# Patient Record
Sex: Male | Born: 1955 | Race: White | Hispanic: No | Smoking: Former smoker
Health system: Southern US, Community
[De-identification: ages and names within clinical notes are randomized; demographics above are authoritative.]

## PROBLEM LIST (undated history)

## (undated) DIAGNOSIS — N529 Male erectile dysfunction, unspecified: Secondary | ICD-10-CM

## (undated) DIAGNOSIS — G4700 Insomnia, unspecified: Secondary | ICD-10-CM

## (undated) DIAGNOSIS — Z860100 Personal history of colon polyps, unspecified: Secondary | ICD-10-CM

## (undated) DIAGNOSIS — G4730 Sleep apnea, unspecified: Secondary | ICD-10-CM

## (undated) DIAGNOSIS — C61 Malignant neoplasm of prostate: Secondary | ICD-10-CM

## (undated) DIAGNOSIS — T7840XA Allergy, unspecified, initial encounter: Secondary | ICD-10-CM

## (undated) HISTORY — PX: VASECTOMY: SHX75

## (undated) HISTORY — DX: Personal history of colonic polyps: Z86.010

## (undated) HISTORY — PX: COLONOSCOPY: SHX174

## (undated) HISTORY — DX: Personal history of colon polyps, unspecified: Z86.0100

## (undated) HISTORY — PX: POLYPECTOMY: SHX149

## (undated) HISTORY — DX: Malignant neoplasm of prostate: C61

## (undated) HISTORY — DX: Allergy, unspecified, initial encounter: T78.40XA

---

## 2002-10-28 ENCOUNTER — Emergency Department (HOSPITAL_COMMUNITY): Admission: EM | Admit: 2002-10-28 | Discharge: 2002-10-28 | Payer: Self-pay | Admitting: Emergency Medicine

## 2007-08-20 ENCOUNTER — Ambulatory Visit: Payer: Self-pay | Admitting: Psychiatry

## 2007-08-25 ENCOUNTER — Ambulatory Visit: Payer: Self-pay | Admitting: Psychiatry

## 2007-09-02 ENCOUNTER — Ambulatory Visit: Payer: Self-pay | Admitting: Psychiatry

## 2007-09-11 ENCOUNTER — Ambulatory Visit: Payer: Self-pay | Admitting: Internal Medicine

## 2007-09-16 ENCOUNTER — Ambulatory Visit: Payer: Self-pay | Admitting: Psychology

## 2007-09-18 ENCOUNTER — Ambulatory Visit: Payer: Self-pay | Admitting: Psychiatry

## 2007-09-26 ENCOUNTER — Encounter: Payer: Self-pay | Admitting: Family Medicine

## 2007-09-26 ENCOUNTER — Encounter: Payer: Self-pay | Admitting: Internal Medicine

## 2007-09-26 ENCOUNTER — Ambulatory Visit: Payer: Self-pay | Admitting: Internal Medicine

## 2007-09-29 ENCOUNTER — Encounter: Payer: Self-pay | Admitting: Internal Medicine

## 2007-10-10 ENCOUNTER — Ambulatory Visit: Payer: Self-pay | Admitting: Psychology

## 2007-10-16 ENCOUNTER — Ambulatory Visit: Payer: Self-pay | Admitting: Psychology

## 2007-10-30 ENCOUNTER — Ambulatory Visit: Payer: Self-pay | Admitting: Psychology

## 2007-12-02 ENCOUNTER — Ambulatory Visit: Payer: Self-pay | Admitting: Psychology

## 2007-12-08 ENCOUNTER — Ambulatory Visit: Payer: Self-pay | Admitting: Psychiatry

## 2008-02-03 ENCOUNTER — Ambulatory Visit: Payer: Self-pay | Admitting: Psychology

## 2008-05-31 ENCOUNTER — Ambulatory Visit: Payer: Self-pay | Admitting: Family Medicine

## 2008-05-31 DIAGNOSIS — J309 Allergic rhinitis, unspecified: Secondary | ICD-10-CM | POA: Insufficient documentation

## 2008-06-28 ENCOUNTER — Telehealth: Payer: Self-pay | Admitting: Family Medicine

## 2008-06-28 ENCOUNTER — Ambulatory Visit: Payer: Self-pay | Admitting: Family Medicine

## 2008-06-28 DIAGNOSIS — L255 Unspecified contact dermatitis due to plants, except food: Secondary | ICD-10-CM

## 2008-07-07 ENCOUNTER — Telehealth: Payer: Self-pay | Admitting: Family Medicine

## 2008-08-05 ENCOUNTER — Ambulatory Visit: Payer: Self-pay | Admitting: Psychology

## 2008-09-01 ENCOUNTER — Ambulatory Visit: Payer: Self-pay | Admitting: Psychology

## 2008-09-30 ENCOUNTER — Ambulatory Visit: Payer: Self-pay | Admitting: Family Medicine

## 2008-09-30 DIAGNOSIS — M545 Low back pain, unspecified: Secondary | ICD-10-CM | POA: Insufficient documentation

## 2008-10-11 ENCOUNTER — Ambulatory Visit: Payer: Self-pay | Admitting: Family Medicine

## 2008-10-11 LAB — CONVERTED CEMR LAB
AST: 18 units/L (ref 0–37)
Alkaline Phosphatase: 58 units/L (ref 39–117)
BUN: 23 mg/dL (ref 6–23)
Basophils Absolute: 0.1 10*3/uL (ref 0.0–0.1)
Calcium: 9.3 mg/dL (ref 8.4–10.5)
Cholesterol: 144 mg/dL (ref 0–200)
Creatinine, Ser: 1.2 mg/dL (ref 0.4–1.5)
Eosinophils Absolute: 0.1 10*3/uL (ref 0.0–0.7)
GFR calc non Af Amer: 67.51 mL/min (ref >60)
Glucose, Bld: 99 mg/dL (ref 70–99)
HDL: 39.5 mg/dL (ref >39.00)
Lymphocytes Relative: 15.8 % (ref 12.0–46.0)
MCHC: 34.4 g/dL (ref 30.0–36.0)
Monocytes Relative: 8.1 % (ref 3.0–12.0)
Neutrophils Relative %: 73.2 % (ref 43.0–77.0)
Nitrite: NEGATIVE
Platelets: 191 10*3/uL (ref 150.0–400.0)
RDW: 11.4 % — ABNORMAL LOW (ref 11.5–14.6)
Sodium: 143 meq/L (ref 135–145)
Specific Gravity, Urine: >=1.03 (ref 1.000–1.030)
Total Bilirubin: 0.8 mg/dL (ref 0.3–1.2)
Total Protein, Urine: NEGATIVE mg/dL
Triglycerides: 70 mg/dL (ref 0.0–149.0)
Urine Glucose: NEGATIVE mg/dL
Urobilinogen, UA: 0.2 (ref 0.0–1.0)
VLDL: 14 mg/dL (ref 0.0–40.0)

## 2008-10-20 ENCOUNTER — Ambulatory Visit: Payer: Self-pay | Admitting: Family Medicine

## 2009-01-19 ENCOUNTER — Ambulatory Visit: Payer: Self-pay | Admitting: Family Medicine

## 2009-03-02 ENCOUNTER — Ambulatory Visit: Payer: Self-pay | Admitting: Family Medicine

## 2009-05-18 ENCOUNTER — Ambulatory Visit: Payer: Self-pay | Admitting: Family Medicine

## 2009-08-16 ENCOUNTER — Ambulatory Visit: Payer: Self-pay | Admitting: Family Medicine

## 2009-08-16 DIAGNOSIS — IMO0002 Reserved for concepts with insufficient information to code with codable children: Secondary | ICD-10-CM | POA: Insufficient documentation

## 2009-09-15 ENCOUNTER — Encounter (INDEPENDENT_AMBULATORY_CARE_PROVIDER_SITE_OTHER): Payer: Self-pay | Admitting: *Deleted

## 2009-12-02 DIAGNOSIS — Z860100 Personal history of colon polyps, unspecified: Secondary | ICD-10-CM | POA: Insufficient documentation

## 2009-12-09 ENCOUNTER — Ambulatory Visit: Payer: Self-pay | Admitting: Internal Medicine

## 2010-02-24 ENCOUNTER — Ambulatory Visit: Payer: Self-pay | Admitting: Family Medicine

## 2010-02-27 LAB — CONVERTED CEMR LAB
ALT: 44 units/L (ref 0–53)
AST: 32 units/L (ref 0–37)
Albumin: 4 g/dL (ref 3.5–5.2)
Alkaline Phosphatase: 57 units/L (ref 39–117)
Basophils Absolute: 0 10*3/uL (ref 0.0–0.1)
Basophils Relative: 0.4 % (ref 0.0–3.0)
Bilirubin Urine: NEGATIVE
CO2: 28 meq/L (ref 19–32)
Calcium: 9.7 mg/dL (ref 8.4–10.5)
Eosinophils Relative: 2.1 % (ref 0.0–5.0)
GFR calc non Af Amer: 71.25 mL/min (ref >60.00)
Glucose, Bld: 85 mg/dL (ref 70–99)
HCT: 45.7 % (ref 39.0–52.0)
HDL: 43.2 mg/dL (ref >39.00)
Hemoglobin: 15.5 g/dL (ref 13.0–17.0)
Leukocytes, UA: NEGATIVE
Lymphocytes Relative: 22.4 % (ref 12.0–46.0)
Lymphs Abs: 1.2 10*3/uL (ref 0.7–4.0)
Monocytes Relative: 9.2 % (ref 3.0–12.0)
Neutro Abs: 3.7 10*3/uL (ref 1.4–7.7)
Nitrite: NEGATIVE
PSA: 2.03 ng/mL (ref 0.10–4.00)
Potassium: 4.8 meq/L (ref 3.5–5.1)
RBC: 5.04 M/uL (ref 4.22–5.81)
RDW: 12.6 % (ref 11.5–14.6)
Sodium: 140 meq/L (ref 135–145)
Specific Gravity, Urine: >=1.03 (ref 1.000–1.030)
TSH: 1.33 microintl units/mL (ref 0.35–5.50)
Total CHOL/HDL Ratio: 4
Total Protein, Urine: NEGATIVE mg/dL
Total Protein: 6.7 g/dL (ref 6.0–8.3)
pH: 6 (ref 5.0–8.0)

## 2010-03-03 ENCOUNTER — Ambulatory Visit: Payer: Self-pay | Admitting: Family Medicine

## 2010-03-10 ENCOUNTER — Telehealth: Payer: Self-pay | Admitting: Family Medicine

## 2010-04-11 NOTE — Assessment & Plan Note (Signed)
Summary: ?sinus inf/cough/cjr   Vital Signs:  Patient profile:   55 year old male Temp:     98.7 degrees F oral BP sitting:   130 / 84  (left arm) Cuff size:   regular  Vitals Entered By: Sid Falcon LPN (May 18, 1608 4:15 PM) CC: Sinus infection, cough   History of Present Illness: Possible sinusitis.  Over 1 week ago had typical URI "cold" like symptoms.  Now has maxillary facial pain and discolored nasal discharge.  Malaise and intermittent mild headaches.  No fever.  Rare dry cough.  OTC meds without relief.  Allergies: No Known Drug Allergies  Past History:  Past Medical History: Last updated: 05/31/2008 perennial and seasonal allergic rhinitis PMH reviewed for relevance  Review of Systems      See HPI  Physical Exam  General:  Well-developed,well-nourished,in no acute distress; alert,appropriate and cooperative throughout examination Ears:  External ear exam shows no significant lesions or deformities.  Otoscopic examination reveals clear canals, tympanic membranes are intact bilaterally without bulging, retraction, inflammation or discharge. Hearing is grossly normal bilaterally. Nose:  erythematous nasal mucosa. Mouth:  Oral mucosa and oropharynx without lesions or exudates.  Teeth in good repair. Neck:  No deformities, masses, or tenderness noted. Lungs:  Normal respiratory effort, chest expands symmetrically. Lungs are clear to auscultation, no crackles or wheezes. Heart:  Normal rate and regular rhythm. S1 and S2 normal without gallop, murmur, click, rub or other extra sounds.   Impression & Recommendations:  Problem # 1:  SINUSITIS, ACUTE (ICD-461.9) try netti pot saline irrigation and start antibiotic. His updated medication list for this problem includes:    Astelin 137 Mcg/spray Soln (Azelastine hcl) ..... One to two sprays to each nostril two times a day as needed    Azithromycin 250 Mg Tabs (Azithromycin) .Marland Kitchen... 2 by mouth today then one by mouth  once daily for 4 days  Complete Medication List: 1)  Centrum Silver Ultra Mens Tabs (Multiple vitamins-minerals) .... Once daily 2)  Fexofenadine Hcl 60 Mg Tabs (Fexofenadine hcl) .... One by mouth two times a day as needed 3)  Zolpidem Tartrate 10 Mg Tabs (Zolpidem tartrate) .... One by mouth at bedtime 4)  Cyclobenzaprine Hcl 10 Mg Tabs (Cyclobenzaprine hcl) .... One by mouth q 8 hours as needed 5)  Naproxen 500 Mg Tabs (Naproxen) .... One by mouth two times a day with food as needed pain 6)  Viagra 100 Mg Tabs (Sildenafil citrate) .... One daily as needed 7)  Astelin 137 Mcg/spray Soln (Azelastine hcl) .... One to two sprays to each nostril two times a day as needed 8)  Azithromycin 250 Mg Tabs (Azithromycin) .... 2 by mouth today then one by mouth once daily for 4 days  Patient Instructions: 1)  Consider use of Netti pot with saline solution for sinus irrigation. Prescriptions: AZITHROMYCIN 250 MG TABS (AZITHROMYCIN) 2 by mouth today then one by mouth once daily for 4 days  #6 x 0   Entered and Authorized by:   Evelena Peat MD   Signed by:   Evelena Peat MD on 05/18/2009   Method used:   Electronically to        [facility] [address] [phone]* (retail)       [address] [place], [place]  [address]       Ph: [phone]       Fax: [phone]   RxID:   [id number]

## 2010-04-11 NOTE — Assessment & Plan Note (Signed)
Summary: L FOOT INJURY // RS   Vital Signs:  Patient profile:   54 year old male Temp:     98.0 degrees F oral BP sitting:   110 / 72  (left arm) Cuff size:   regular  Vitals Entered By: Sid Falcon LPN (August 17, 3218 8:50 AM)  History of Present Illness: This past Saturday splinter in L foot.  Splinter went through his sneaker. Pt able to pull out and had some mild bleeding.  Did not see any retained splinter but pain with ambulation past week.  No signs of infection.  Tetanus last year.  No fever.  Allergies: No Known Drug Allergies  Past History:  Past Medical History: Last updated: 05/31/2008 perennial and seasonal allergic rhinitis  Review of Systems      See HPI  Physical Exam  General:  Well-developed,well-nourished,in no acute distress; alert,appropriate and cooperative throughout examination Extremities:  L foot mid plantar aspect small punctate area of perforation.  No splinter or FB palpated or seen.  Minimal swelling.  No erythema or drainage.  Minimally tender.   Impression & Recommendations:  Problem # 1:  FOOT&TOE SUP FB W/O MAJ OPN WND&W/O MENTION INF (ICD-917.6) Assessment New no signs of infection.  Pain less today. Offered further exploration but decided agaisnst since he has had some improvement.  Complete Medication List: 1)  Centrum Silver Ultra Mens Tabs (Multiple vitamins-minerals) .... Once daily 2)  Zolpidem Tartrate 10 Mg Tabs (Zolpidem tartrate) .... One by mouth at bedtime 3)  Viagra 100 Mg Tabs (Sildenafil citrate) .... One daily as needed 4)  Astelin 137 Mcg/spray Soln (Azelastine hcl) .... One to two sprays to each nostril two times a day as needed  Patient Instructions: 1)  Follow up promptly for any redness, swelling, or drainage.

## 2010-04-11 NOTE — Assessment & Plan Note (Signed)
Summary: discuss endo//f/u--ch.   History of Present Illness Visit Type: Follow-up Visit Primary GI MD: Lina Sar MD Primary Provider: Evelena Peat, MD Chief Complaint: pt got a EGD recall letter. Pt does not understand why he is here. Pt denies any GI sx. History of Present Illness:   Patient recieved a recall endoscopy letter from our office that was apparently sent in error. Patient has never had an endoscopy nor has he ever experienced any upper GI symptoms.   GI Review of Systems      Denies abdominal pain, acid reflux, belching, bloating, chest pain, dysphagia with liquids, dysphagia with solids, heartburn, loss of appetite, nausea, vomiting, vomiting blood, weight loss, and  weight gain.        Denies anal fissure, black tarry stools, change in bowel habit, constipation, diarrhea, diverticulosis, fecal incontinence, heme positive stool, hemorrhoids, irritable bowel syndrome, jaundice, light color stool, liver problems, rectal bleeding, and  rectal pain.    Current Medications (verified): 1)  Centrum Silver Ultra Mens  Tabs (Multiple Vitamins-Minerals) .... Once Daily 2)  Zolpidem Tartrate 10 Mg Tabs (Zolpidem Tartrate) .... One By Mouth At Bedtime 3)  Viagra 100 Mg Tabs (Sildenafil Citrate) .... One Daily As Needed 4)  Astelin 137 Mcg/spray Soln (Azelastine Hcl) .... One To Two Sprays To Each Nostril Two Times A Day As Needed  Allergies (verified): No Known Drug Allergies  Past History:  Past Medical History: Reviewed history from 12/02/2009 and no changes required. perennial and seasonal allergic rhinitis Current Problems:  COLONIC POLYPS, HYPERPLASTIC, HX OF (ICD-V12.72) FOOT&TOE SUP FB W/O MAJ OPN WND&W/O MENTION INF (ICD-917.6) ROUTINE GENERAL MEDICAL EXAM@HEALTH  CARE FACL (ICD-V70.0) LUMBAGO (ICD-724.2) RHUS DERMATITIS (ICD-692.6) RHINITIS (ICD-477.9)    Past Surgical History: Reviewed history from 05/31/2008 and no changes required. Vasectomy  Family  History: Reviewed history from 12/02/2009 and no changes required. Prostate cancer-Father  Social History: Reviewed history from 05/31/2008 and no changes required. Occupation: Insurance underwriter Divorced Former Smoker Alcohol use-yes Regular exercise-yes  Vital Signs:  Patient profile:   55 year old male Height:      74 inches Weight:      218 pounds BMI:     28.09 Pulse rate:   70 / minute Pulse rhythm:   regular BP sitting:   126 / 74  (right arm) Cuff size:   regular  Vitals Entered By: Christie Nottingham CMA Duncan Dull) (December 09, 2009 9:26 AM)   Impression & Recommendations:  Problem # 1:  COLONIC POLYPS, HYPERPLASTIC, HX OF (ICD-V12.72) Patient has a history of hyperplastic colonic polyps on his colonoscopy which was completed in 2009. Patient comes today after recieving a recall endoscopy letter in the mail in error. He has no need at this time for an endoscopy.  Patient Instructions: 1)  Follow up as needed. 2)  The medication list was reviewed and reconciled.  All changed / newly prescribed medications were explained.  A complete medication list was provided to the patient / caregiver.

## 2010-04-11 NOTE — Letter (Signed)
Summary: Endoscopy- Changed to Office Visit  [facility]  [address] [place], [place] [address]   Phone: [phone]  Fax: [phone]      September 15, 2009 MRN: [id number]   [facility] [address] [place], [place]  [address]   Dear Mr. MERFELD,   According to our records, it is time for you to schedule an Endoscopy. However, after reviewing your medical record, I feel that an office visit would be most appropriate to more completely evaluate you and determine your need for a repeat procedure.  Please call [phone] (option #2) at your convenience to schedule an office visit. If you have any questions, concerns, or feel that this letter is in error, we would appreciate your call.   Sincerely,  Hedwig Morton. Juanda Chance, M.D.  [facility] [phone]

## 2010-04-13 NOTE — Assessment & Plan Note (Signed)
Summary: CPX // RS   Vital Signs:  Patient profile:   55 year old male Height:      73.5 inches Weight:      220 pounds O2 Sat:      98 % Temp:     98.2 degrees F Pulse rate:   77 / minute BP sitting:   122 / 82  (left arm) Cuff size:   large  Vitals Entered By: Pura Spice, RN (March 03, 2010 10:29 AM) CC: cpx no complaints discuss labs    History of Present Illness: Here for CPE.  Still struggling with issues of recent divorce. No major depression issues but does feel down at times.  No regular exercise. Has resumed some smoking and would like to stop all together. He would like to consider Wellbutrin both for mood and to see if this will help with smoking cessation.  Clinical Review Panels:  Prevention   Last Colonoscopy:  Location:  [facility].  (09/26/2007)   Last PSA:  2.03 (02/27/2010)  Immunizations   Last Tetanus Booster:  Tdap (10/20/2008)  Lipid Management   Cholesterol:  190 (02/27/2010)   LDL (bad choesterol):  121 (02/27/2010)   HDL (good cholesterol):  43.20 (02/27/2010)  Diabetes Management   Creatinine:  1.1 (02/27/2010)  CBC   WBC:  5.6 (02/27/2010)   RBC:  5.04 (02/27/2010)   Hgb:  15.5 (02/27/2010)   Hct:  45.7 (02/27/2010)   Platelets:  196.0 (02/27/2010)   MCV  90.7 (02/27/2010)   MCHC  34.0 (02/27/2010)   RDW  12.6 (02/27/2010)   PMN:  65.9 (02/27/2010)   Lymphs:  22.4 (02/27/2010)   Monos:  9.2 (02/27/2010)   Eosinophils:  2.1 (02/27/2010)   Basophil:  0.4 (02/27/2010)  Complete Metabolic Panel   Glucose:  85 (02/27/2010)   Sodium:  140 (02/27/2010)   Potassium:  4.8 (02/27/2010)   Chloride:  104 (02/27/2010)   CO2:  28 (02/27/2010)   BUN:  18 (02/27/2010)   Creatinine:  1.1 (02/27/2010)   Albumin:  4.0 (02/27/2010)   Total Protein:  6.7 (02/27/2010)   Calcium:  9.7 (02/27/2010)   Total Bili:  1.1 (02/27/2010)   Alk Phos:  57 (02/27/2010)   SGPT (ALT):  44 (02/27/2010)   SGOT (AST):  32  (02/27/2010)   Allergies: No Known Drug Allergies  Past History:  Past Medical History: Last updated: 12/02/2009 perennial and seasonal allergic rhinitis Current Problems:  COLONIC POLYPS, HYPERPLASTIC, HX OF (ICD-V12.72) FOOT&TOE SUP FB W/O MAJ OPN WND&W/O MENTION INF (ICD-917.6) ROUTINE GENERAL MEDICAL EXAM@HEALTH  CARE FACL (ICD-V70.0) LUMBAGO (ICD-724.2) RHUS DERMATITIS (ICD-692.6) RHINITIS (ICD-477.9)    Past Surgical History: Last updated: 05/31/2008 Vasectomy  Family History: Last updated: 12/02/2009 Prostate cancer-Father  Social History: Last updated: 05/31/2008 Occupation: Insurance underwriter Divorced Former Smoker Alcohol use-yes Regular exercise-yes  Risk Factors: Exercise: yes (05/31/2008)  Risk Factors: Smoking Status: quit (05/31/2008) PMH-FH-SH reviewed for relevance  Review of Systems  The patient denies anorexia, fever, weight loss, weight gain, vision loss, decreased hearing, hoarseness, chest pain, syncope, dyspnea on exertion, peripheral edema, prolonged cough, headaches, hemoptysis, abdominal pain, melena, hematochezia, severe indigestion/heartburn, hematuria, incontinence, genital sores, muscle weakness, suspicious skin lesions, transient blindness, difficulty walking, depression, unusual weight change, abnormal bleeding, enlarged lymph nodes, and testicular masses.    Physical Exam  General:  Well-developed,well-nourished,in no acute distress; alert,appropriate and cooperative throughout examination Head:  Normocephalic and atraumatic without obvious abnormalities. No apparent alopecia or balding. Eyes:  No corneal or conjunctival  inflammation noted. EOMI. Perrla. Funduscopic exam benign, without hemorrhages, exudates or papilledema. Vision grossly normal. Ears:  External ear exam shows no significant lesions or deformities.  Otoscopic examination reveals clear canals, tympanic membranes are intact bilaterally without bulging, retraction,  inflammation or discharge. Hearing is grossly normal bilaterally. Mouth:  Oral mucosa and oropharynx without lesions or exudates.  Teeth in good repair. Neck:  No deformities, masses, or tenderness noted. Lungs:  Normal respiratory effort, chest expands symmetrically. Lungs are clear to auscultation, no crackles or wheezes. Heart:  Normal rate and regular rhythm. S1 and S2 normal without gallop, murmur, click, rub or other extra sounds. Abdomen:  Bowel sounds positive,abdomen soft and non-tender without masses, organomegaly or hernias noted. Rectal:  No external abnormalities noted. Normal sphincter tone. No rectal masses or tenderness. Genitalia:  Testes bilaterally descended without nodularity, tenderness or masses. No scrotal masses or lesions. No penis lesions or urethral discharge. Prostate:  Prostate gland firm and smooth, no enlargement, nodularity, tenderness, mass, asymmetry or induration. Msk:  No deformity or scoliosis noted of thoracic or lumbar spine.   Extremities:  No clubbing, cyanosis, edema, or deformity noted with normal full range of motion of all joints.   Neurologic:  alert & oriented X3 and cranial nerves II-XII intact.   Skin:  no rashes and no suspicious lesions.   Cervical Nodes:  No lymphadenopathy noted Psych:  normally interactive, good eye contact, not anxious appearing, and not depressed appearing.     Impression & Recommendations:  Problem # 1:  ROUTINE GENERAL MEDICAL EXAM@HEALTH  CARE FACL (ICD-V70.0) Establish more regular exercise.  Labs reviewed with pt and all OK.  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  Complete Medication List: 1)  Centrum Silver Ultra Mens Tabs (Multiple vitamins-minerals) .... Once daily 2)  Zolpidem Tartrate 10 Mg Tabs (Zolpidem tartrate) .... One by mouth at bedtime 3)  Cialis 20 Mg Tabs (Tadalafil) .... One by mouth every other day as  needed 4)  Bupropion Hcl 150 Mg Xr24h-tab (Bupropion hcl) .... One by mouth once daily  Patient Instructions: 1)  Please schedule a follow-up appointment in 1 year.  2)  It is important that you exercise reguarly at least 20 minutes 5 times a week. If you develop chest pain, have severe difficulty breathing, or feel very tired, stop exercising immediately and seek medical attention.  Prescriptions: BUPROPION HCL 150 MG XR24H-TAB (BUPROPION HCL) one by mouth once daily  #30 x 11   Entered and Authorized by:   Evelena Peat MD   Signed by:   Evelena Peat MD on 03/03/2010   Method used:   Electronically to        [facility] [address] [phone]* (retail)       [address] [place], [place]  [address]       Ph: [phone]       Fax: [phone]   RxID:   [id number] CIALIS 20 MG TABS (TADALAFIL) one by mouth every other day as needed  #6 x 11   Entered and Authorized by:   Evelena Peat MD   Signed by:   Evelena Peat MD on 03/03/2010   Method used:   Electronically to        [facility] [address] [phone]* (retail)       [address]       [place], [place]  [address]  Ph: [phone]       Fax: [phone]   RxID:   [id number]    Orders Added: 1)  Est. Patient 40-64 years [[id number]]

## 2010-04-13 NOTE — Progress Notes (Signed)
Summary: Pt having reactions to Wellbutrin  Phone Note Call from Patient Call back at Home Phone [phone]   Caller: Patient Summary of Call: Pt called and said that Welbutrin in not agreeing with him. Pt says he is very anxious, irratability, restless sleep, dry mouth. Pls advise.  Initial call taken by: Lucy Antigua,  March 10, 2010 12:03 PM  Follow-up for Phone Call        Take in AM to reduce insomnia.  If other side effects intolerable, discontinue. Follow-up by: Evelena Peat MD,  March 10, 2010 1:12 PM  Additional Follow-up for Phone Call Additional follow up Details #1::        Pt is already taking Welbutrin in AM.  He is going to D/C.  Any alternative recomendations? Sid Falcon LPN  March 10, 2010 1:16 PM We were using primarily for smoking cessation. I would not rec any other meds at this time unless he has any progressive depressive symptoms. 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

## 2010-05-06 ENCOUNTER — Other Ambulatory Visit: Payer: Self-pay | Admitting: Family Medicine

## 2010-05-09 MED ORDER — ZOLPIDEM TARTRATE 10 MG PO TABS: 10.0000 mg | ORAL_TABLET | Freq: Every evening | ORAL | Status: DC | PRN

## 2010-05-09 NOTE — Telephone Encounter (Signed)
OK to refill for 6 months 

## 2010-05-09 NOTE — Telephone Encounter (Signed)
Rx printed by mistake #30 with 0 refills.  Reordered #30 with 5 refill per Dr Caryl Never, Rx called to pharmacy

## 2010-05-09 NOTE — Telephone Encounter (Signed)
Last filled 07/08/08, #30 with 6 refills.  Pt had CPX in Dec 2011

## 2010-05-09 NOTE — Telephone Encounter (Signed)
Duplicate med request note, this was called in #30 with 5 refills.

## 2010-05-31 ENCOUNTER — Encounter: Payer: Self-pay | Admitting: Family Medicine

## 2010-06-01 ENCOUNTER — Ambulatory Visit (INDEPENDENT_AMBULATORY_CARE_PROVIDER_SITE_OTHER): Payer: 59 | Admitting: Family Medicine

## 2010-06-01 ENCOUNTER — Encounter: Payer: Self-pay | Admitting: Family Medicine

## 2010-06-01 DIAGNOSIS — Z113 Encounter for screening for infections with a predominantly sexual mode of transmission: Secondary | ICD-10-CM

## 2010-06-01 DIAGNOSIS — M545 Low back pain: Secondary | ICD-10-CM

## 2010-06-01 MED ORDER — NAPROXEN 500 MG PO TABS: 500.0000 mg | ORAL_TABLET | Freq: Two times a day (BID) | ORAL | Status: DC

## 2010-06-01 MED ORDER — CYCLOBENZAPRINE HCL 10 MG PO TABS: 10.0000 mg | ORAL_TABLET | Freq: Three times a day (TID) | ORAL | Status: AC | PRN

## 2010-06-01 NOTE — Progress Notes (Signed)
  Subjective:    Patient ID: Timothy Harrell, male    DOB: 10-Mar-1956, 55 y.o.   MRN: [id number]  HPI  patient seen for the following issues  Requests screening for STD. No concerning symptoms. Divorced couple of years ago. Has only been with one other partner since then. She had been tested and no history of STD. He now has new girlfriend and wishes to have negative screening before engaging in any sexual relations. Denies any dysuria, fever, chills , penile discharge, or any rashes. No appetite or weight changes.    recurrent low back pain. Similar occurrences in the past. No recent injury. Dull ache lower lumbar area in similar discomfort lower cervical region. No radiculopathy symptoms. Exacerbated by movement and lifting. Severity of pain is moderate. No numbness or weakness. Has responded well to naproxen and Flexeril in the past.   Review of Systems  Constitutional: Negative for fever, chills, activity change, appetite change, fatigue and unexpected weight change.  Respiratory: Negative for cough and shortness of breath.   Cardiovascular: Negative for chest pain, palpitations and leg swelling.  Gastrointestinal: Negative for abdominal pain and abdominal distention.  Genitourinary: Negative for dysuria, hematuria and discharge.  Musculoskeletal: Positive for back pain. Negative for myalgias.  Skin: Negative for rash.  Neurological: Negative for weakness.  Hematological: Negative for adenopathy.       Objective:   Physical Exam  Constitutional: He is oriented to person, place, and time. He appears well-developed and well-nourished.  HENT:  Head: Normocephalic and atraumatic.  Neck: Normal range of motion. Neck supple.  Cardiovascular: Normal rate, regular rhythm and normal heart sounds.   No murmur heard. Pulmonary/Chest: Effort normal and breath sounds normal. He has no wheezes. He has no rales.  Musculoskeletal:        Minimal tenderness lower lumbar region. Straight leg raise  is negative bilaterally.  Lymphadenopathy:    He has no cervical adenopathy.  Neurological: He is alert and oriented to person, place, and time.        Deep tendon reflexes symmetric lower extremities. Full-strength.          Assessment & Plan:   #1 STD screening. Overall low risk. Check hepatitis B surface antigen, HIV, RPR, and urine screen for GC and chlamydia.  #2 recurrent low back pain. Refill naproxen and Flexeril for when necessary use.

## 2010-06-02 NOTE — Progress Notes (Signed)
Quick Note:  Pt informed ______ 

## 2010-07-05 ENCOUNTER — Encounter: Payer: Self-pay | Admitting: Family Medicine

## 2010-07-05 ENCOUNTER — Ambulatory Visit (INDEPENDENT_AMBULATORY_CARE_PROVIDER_SITE_OTHER): Payer: 59 | Admitting: Family Medicine

## 2010-07-05 DIAGNOSIS — M79609 Pain in unspecified limb: Secondary | ICD-10-CM

## 2010-07-05 DIAGNOSIS — D179 Benign lipomatous neoplasm, unspecified: Secondary | ICD-10-CM

## 2010-07-05 NOTE — Progress Notes (Signed)
  Subjective:    Patient ID: Timothy Harrell, male    DOB: February 13, 1956, 55 y.o.   MRN: [id number]  HPI Patient seen with left arm pain somewhat progressive over one year. No injury. Pain is lateral aspect of the arm. 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 Possibly some mild swelling. No lymphadenopathy. No appetite or weight changes. Pain occurs at rest it is not triggered by any specific movements. Full range of motion elbow. No shoulder or neck pain.   Review of Systems  Constitutional: Negative for appetite change and unexpected weight change.  Musculoskeletal: Negative for myalgias, joint swelling and arthralgias.  Skin: Negative for rash.  Hematological: Negative for adenopathy.       Objective:   Physical Exam  Constitutional: He appears well-developed and well-nourished. No distress.  Cardiovascular: Normal rate, regular rhythm and normal heart sounds.   No murmur heard. Pulmonary/Chest: Effort normal and breath sounds normal. No respiratory distress.  Musculoskeletal:       Left arm reveals no edema or ecchymosis. No skin rash. 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 This is slightly tender to palpation. No axillary adenopathy. Full range of motion elbow , wrist, and shoulder  Neurological:       Full strength left upper extremity. Normal sensory function to touch          Assessment & Plan:  Left arm pain. This seems to be in region of lipomatous mass. We explained lipomas are rearely cancerous or dangerous. Given the fact he had some progressive pain for a year referral to general surgeon for consideration of excision which patient is interested in

## 2010-07-05 NOTE — Patient Instructions (Signed)

## 2010-07-25 ENCOUNTER — Other Ambulatory Visit: Payer: Self-pay | Admitting: Family Medicine

## 2010-08-17 ENCOUNTER — Ambulatory Visit (INDEPENDENT_AMBULATORY_CARE_PROVIDER_SITE_OTHER): Payer: 59 | Admitting: Psychology

## 2010-08-17 DIAGNOSIS — F4323 Adjustment disorder with mixed anxiety and depressed mood: Secondary | ICD-10-CM

## 2010-08-30 ENCOUNTER — Ambulatory Visit (HOSPITAL_BASED_OUTPATIENT_CLINIC_OR_DEPARTMENT_OTHER)
Admission: RE | Admit: 2010-08-30 | Discharge: 2010-08-30 | Disposition: A | Discharge status: Home / Self Care | Payer: 59 | Source: Ambulatory Visit | Attending: General Surgery | Admitting: General Surgery

## 2010-08-30 ENCOUNTER — Other Ambulatory Visit (INDEPENDENT_AMBULATORY_CARE_PROVIDER_SITE_OTHER): Payer: Self-pay | Admitting: General Surgery

## 2010-08-30 DIAGNOSIS — D1739 Benign lipomatous neoplasm of skin and subcutaneous tissue of other sites: Secondary | ICD-10-CM | POA: Insufficient documentation

## 2010-09-07 ENCOUNTER — Ambulatory Visit (INDEPENDENT_AMBULATORY_CARE_PROVIDER_SITE_OTHER): Payer: 59

## 2010-09-19 NOTE — Op Note (Signed)
  NAMECOLESTON, DIROSA             ACCOUNT NO.:  [id number]  MEDICAL RECORD NO.:  [id number]  LOCATION:                                 FACILITY:  PHYSICIAN:  Sharlet Salina T. Karisa Nesser, M.D.  DATE OF BIRTH:  DATE OF PROCEDURE:  08/30/2010 DATE OF DISCHARGE:                              OPERATIVE REPORT   SURGEON:  Sharlet Salina T. Hank Walling, MD  PREOPERATIVE DIAGNOSIS:  Subcutaneous mass, probable lipoma, upper left arm.  POSTOPERATIVE DIAGNOSIS:  Subcutaneous mass, probable lipoma, upper left arm.  DESCRIPTION OF PROCEDURE:  The patient was brought to the operating room and placed comfortably in the supine position.  The left upper arm was sterilely prepped and draped and the patient time-out performed.  Local anesthesia was used to infiltrate the skin and underlying soft tissue. A small transverse incision was made and dissection was carried down bluntly into the subcutaneous tissue.  A very discrete approximately 2 cm fatty mass was dissected free and removed intact.  The skin was then closed with subcuticular Monocryl and Dermabond.     Lorne Skeens. Allia Wiltsey, M.D.     Tory Emerald  D:  09/15/2010  T:  09/16/2010  Job:  [id number]  Electronically Signed by Glenna Fellows M.D. on 09/19/2010 03:20:29 PM

## 2010-12-23 ENCOUNTER — Other Ambulatory Visit: Payer: Self-pay | Admitting: Family Medicine

## 2010-12-25 NOTE — Telephone Encounter (Signed)
OK to refill for 6 months 

## 2010-12-25 NOTE — Telephone Encounter (Signed)
PRN sleep, last filled 05/09/10, #30 with 5 refills

## 2011-01-21 ENCOUNTER — Other Ambulatory Visit: Payer: Self-pay | Admitting: Family Medicine

## 2011-01-30 ENCOUNTER — Ambulatory Visit (INDEPENDENT_AMBULATORY_CARE_PROVIDER_SITE_OTHER): Payer: 59 | Admitting: Family Medicine

## 2011-01-30 ENCOUNTER — Encounter: Payer: Self-pay | Admitting: Family Medicine

## 2011-01-30 VITALS — BP 110/84 | Temp 98.9°F | Wt 116.0 lb

## 2011-01-30 DIAGNOSIS — R05 Cough: Secondary | ICD-10-CM

## 2011-01-30 DIAGNOSIS — J069 Acute upper respiratory infection, unspecified: Secondary | ICD-10-CM

## 2011-01-30 DIAGNOSIS — R059 Cough, unspecified: Secondary | ICD-10-CM

## 2011-01-30 NOTE — Patient Instructions (Signed)
Viral Infections A viral infection can be caused by different types of viruses.Most viral infections are not serious and resolve on their own. However, some infections may cause severe symptoms and may lead to further complications. SYMPTOMS Viruses can frequently cause:  Minor sore throat.   Aches and pains.   Headaches.   Runny nose.   Different types of rashes.   Watery eyes.   Tiredness.   Cough.   Loss of appetite.   Gastrointestinal infections, resulting in nausea, vomiting, and diarrhea.  These symptoms do not respond to antibiotics because the infection is not caused by bacteria. However, you might catch a bacterial infection following the viral infection. This is sometimes called a "superinfection." Symptoms of such a bacterial infection may include:  Worsening sore throat with pus and difficulty swallowing.   Swollen neck glands.   Chills and a high or persistent fever.   Severe headache.   Tenderness over the sinuses.   Persistent overall ill feeling (malaise), muscle aches, and tiredness (fatigue).   Persistent cough.   Yellow, green, or brown mucus production with coughing.  HOME CARE INSTRUCTIONS   Only take over-the-counter or prescription medicines for pain, discomfort, diarrhea, or fever as directed by your caregiver.   Drink enough water and fluids to keep your urine clear or pale yellow. Sports drinks can provide valuable electrolytes, sugars, and hydration.   Get plenty of rest and maintain proper nutrition. Soups and broths with crackers or rice are fine.  SEEK IMMEDIATE MEDICAL CARE IF:   You have severe headaches, shortness of breath, chest pain, neck pain, or an unusual rash.   You have uncontrolled vomiting, diarrhea, or you are unable to keep down fluids.   You or your child has an oral temperature above 102 F (38.9 C), not controlled by medicine.   Your baby is older than 3 months with a rectal temperature of 102 F (38.9 C) or  higher.   Your baby is 3 months old or younger with a rectal temperature of 100.4 F (38 C) or higher.  MAKE SURE YOU:   Understand these instructions.   Will watch your condition.   Will get help right away if you are not doing well or get worse.  Document Released: 12/06/2004 Document Revised: 11/08/2010 Document Reviewed: 07/03/2010 [employer] Patient Information 2012 [employer]. 

## 2011-01-30 NOTE — Progress Notes (Signed)
  Subjective:    Patient ID: Timothy Harrell, male    DOB: October 29, 1955, 55 y.o.   MRN: [id number]  HPI Patient seen as an acute visit. 4 days ago onset of nasal congestion and body aches and malaise. Now has cough which is occasionally productive. No documented fever. One sick contact at work. The patient had some mild headaches initially but none now. Denies sore throat. No nausea, vomiting, or diarrhea.   Review of Systems As per history of present illness    Objective:   Physical Exam  Constitutional: He appears well-developed and well-nourished.  HENT:  Right Ear: External ear normal.  Left Ear: External ear normal.  Mouth/Throat: Oropharynx is clear and moist.  Neck: Neck supple. No thyromegaly present.  Cardiovascular: Normal rate and regular rhythm.   Pulmonary/Chest: Effort normal and breath sounds normal. No respiratory distress. He has no wheezes. He has no rales.  Lymphadenopathy:    He has no cervical adenopathy.          Assessment & Plan:  Probable viral URI. Reassurance. Continue over-the-counter Mucinex and plenty of fluids. Followup as needed

## 2011-02-28 ENCOUNTER — Ambulatory Visit: Payer: 59

## 2011-02-28 ENCOUNTER — Other Ambulatory Visit: Payer: 59

## 2011-02-28 DIAGNOSIS — Z0389 Encounter for observation for other suspected diseases and conditions ruled out: Secondary | ICD-10-CM

## 2011-02-28 DIAGNOSIS — Z0000 Encounter for general adult medical examination without abnormal findings: Secondary | ICD-10-CM

## 2011-02-28 LAB — CBC WITH DIFFERENTIAL/PLATELET
Basophils Absolute: 0 10*3/uL (ref 0.0–0.1)
Eosinophils Absolute: 0.1 10*3/uL (ref 0.0–0.7)
HCT: 43.5 % (ref 39.0–52.0)
Hemoglobin: 14.9 g/dL (ref 13.0–17.0)
Lymphs Abs: 1.3 10*3/uL (ref 0.7–4.0)
MCHC: 34.2 g/dL (ref 30.0–36.0)
Neutro Abs: 3.3 10*3/uL (ref 1.4–7.7)
Platelets: 195 10*3/uL (ref 150.0–400.0)
RDW: 12.6 % (ref 11.5–14.6)

## 2011-02-28 LAB — BASIC METABOLIC PANEL
BUN: 23 mg/dL (ref 6–23)
Calcium: 9.4 mg/dL (ref 8.4–10.5)
Creatinine, Ser: 1.3 mg/dL (ref 0.4–1.5)
GFR: 63.82 mL/min (ref >60.00)

## 2011-02-28 LAB — LIPID PANEL
Cholesterol: 180 mg/dL (ref 0–200)
LDL Cholesterol: 119 mg/dL — ABNORMAL HIGH (ref 0–99)

## 2011-02-28 LAB — URINALYSIS
Bilirubin Urine: NEGATIVE
Hgb urine dipstick: NEGATIVE
Ketones, ur: NEGATIVE
Leukocytes, UA: NEGATIVE
Urobilinogen, UA: 0.2 (ref 0.0–1.0)

## 2011-02-28 LAB — HEPATIC FUNCTION PANEL
ALT: 33 U/L (ref 0–53)
AST: 24 U/L (ref 0–37)
Alkaline Phosphatase: 55 U/L (ref 39–117)
Bilirubin, Direct: 0.1 mg/dL (ref 0.0–0.3)
Total Protein: 6.6 g/dL (ref 6.0–8.3)

## 2011-03-12 ENCOUNTER — Ambulatory Visit (INDEPENDENT_AMBULATORY_CARE_PROVIDER_SITE_OTHER): Payer: 59 | Admitting: Family Medicine

## 2011-03-12 ENCOUNTER — Encounter: Payer: Self-pay | Admitting: Family Medicine

## 2011-03-12 VITALS — BP 118/82 | HR 80 | Temp 98.5°F | Resp 12 | Ht 73.5 in | Wt 228.0 lb

## 2011-03-12 DIAGNOSIS — Z0000 Encounter for general adult medical examination without abnormal findings: Secondary | ICD-10-CM

## 2011-03-12 DIAGNOSIS — Z23 Encounter for immunization: Secondary | ICD-10-CM

## 2011-03-12 NOTE — Patient Instructions (Signed)
Establish more consistent exercise. Continue yearly physical and yearly PSA

## 2011-03-12 NOTE — Progress Notes (Signed)
  Subjective:    Patient ID: Timothy Harrell, male    DOB: 1955-09-27, 55 y.o.   MRN: [id number]  HPI  Patient here for complete physical. He has intermittent problems with lumbago has had some chronic intermittent insomnia. Takes very low dose Ambien intermittently. Overall very healthy. No chronic problems otherwise. Last tetanus 2010. Colonoscopy age 25-hyperplastic polyps. No flu vaccine yet. Somewhat inconsistent exercise.  Father had prostate cancer. Past medical history, family history, and social history reviewed and as indicated below  Past Medical History  Diagnosis Date  . Allergy   . History of colonic polyps     hyperplastic   Past Surgical History  Procedure Date  . Vasectomy     reports that he has been smoking Cigarettes.  He has smoked for the past 2 years. He does not have any smokeless tobacco history on file. He reports that he drinks alcohol. His drug history not on file. family history includes Cancer in his father. No Known Allergies    Review of Systems  Constitutional: Negative for fever, activity change, appetite change and fatigue.  HENT: Negative for ear pain, congestion and trouble swallowing.   Eyes: Negative for pain and visual disturbance.  Respiratory: Negative for cough, shortness of breath and wheezing.   Cardiovascular: Negative for chest pain and palpitations.  Gastrointestinal: Negative for nausea, vomiting, abdominal pain, diarrhea, constipation, blood in stool, abdominal distention and rectal pain.  Genitourinary: Negative for dysuria, hematuria and testicular pain.  Musculoskeletal: Negative for joint swelling and arthralgias.  Skin: Negative for rash.  Neurological: Negative for dizziness, syncope and headaches.  Hematological: Negative for adenopathy.  Psychiatric/Behavioral: Negative for confusion and dysphoric mood.       Objective:   Physical Exam  Constitutional: He is oriented to person, place, and time. He appears  well-developed and well-nourished. No distress.  HENT:  Head: Normocephalic and atraumatic.  Right Ear: External ear normal.  Left Ear: External ear normal.  Mouth/Throat: Oropharynx is clear and moist.  Eyes: Conjunctivae and EOM are normal. Pupils are equal, round, and reactive to light.  Neck: Normal range of motion. Neck supple. No thyromegaly present.  Cardiovascular: Normal rate, regular rhythm and normal heart sounds.   No murmur heard. Pulmonary/Chest: No respiratory distress. He has no wheezes. He has no rales.  Abdominal: Soft. Bowel sounds are normal. He exhibits no distension and no mass. There is no tenderness. There is no rebound and no guarding.  Genitourinary: Rectum normal and prostate normal.  Musculoskeletal: He exhibits no edema.  Lymphadenopathy:    He has no cervical adenopathy.  Neurological: He is alert and oriented to person, place, and time. He displays normal reflexes. No cranial nerve deficit.  Skin: No rash noted.  Psychiatric: He has a normal mood and affect. His behavior is normal. Judgment and thought content normal.          Assessment & Plan:  Health maintenance. Flu vaccine given. Tetanus up-to-date. Colonoscopy up to date. Establish more consistent exercise. Labs reviewed with patient

## 2011-04-10 ENCOUNTER — Other Ambulatory Visit: Payer: Self-pay | Admitting: Family Medicine

## 2011-05-15 ENCOUNTER — Telehealth: Payer: Self-pay | Admitting: Family Medicine

## 2011-05-15 DIAGNOSIS — R29898 Other symptoms and signs involving the musculoskeletal system: Secondary | ICD-10-CM

## 2011-05-15 NOTE — Telephone Encounter (Signed)
LMTCB requesting additional information and symptoms and length of time he has had this problem with his jaw, one side or both?

## 2011-05-15 NOTE — Telephone Encounter (Signed)
Pt is having a problem with his jaw. Pt is requesting a referral to  specialist

## 2011-05-16 NOTE — Telephone Encounter (Signed)
Pt left another VM explaining his jaw clicks, especially when eating, has been going on for awhile already, now hurting also.

## 2011-05-16 NOTE — Telephone Encounter (Signed)
Pt informed of referral to Dr Chales Salmon

## 2011-05-16 NOTE — Telephone Encounter (Signed)
Suggest Dr Dutch Quint (?sp) oral surgeon.  Sounds like TMJ problem.

## 2011-06-22 ENCOUNTER — Other Ambulatory Visit: Payer: Self-pay | Admitting: Family Medicine

## 2011-06-22 NOTE — Telephone Encounter (Signed)
Was on in old emr system - per dr. Caryl Never - okay to refill

## 2011-06-22 NOTE — Telephone Encounter (Signed)
I don't see this on his current medication list.  Please clarify.

## 2011-06-22 NOTE — Telephone Encounter (Signed)
I dont see this rx'd by you on past or present med list -last seen 02/2011  Please advise

## 2011-08-31 ENCOUNTER — Ambulatory Visit (INDEPENDENT_AMBULATORY_CARE_PROVIDER_SITE_OTHER): Payer: 59 | Admitting: Family Medicine

## 2011-08-31 ENCOUNTER — Encounter: Payer: Self-pay | Admitting: Family Medicine

## 2011-08-31 VITALS — BP 120/80 | Temp 97.4°F | Wt 230.0 lb

## 2011-08-31 DIAGNOSIS — M7740 Metatarsalgia, unspecified foot: Secondary | ICD-10-CM

## 2011-08-31 DIAGNOSIS — M7750 Other enthesopathy of unspecified foot: Secondary | ICD-10-CM

## 2011-08-31 DIAGNOSIS — R5383 Other fatigue: Secondary | ICD-10-CM

## 2011-08-31 DIAGNOSIS — M546 Pain in thoracic spine: Secondary | ICD-10-CM

## 2011-08-31 DIAGNOSIS — M549 Dorsalgia, unspecified: Secondary | ICD-10-CM

## 2011-08-31 MED ORDER — CYCLOBENZAPRINE HCL 10 MG PO TABS: 10.0000 mg | ORAL_TABLET | Freq: Three times a day (TID) | ORAL | Status: AC | PRN

## 2011-08-31 NOTE — Progress Notes (Signed)
  Subjective:    Patient ID: Timothy Harrell, male    DOB: Oct 14, 1955, 56 y.o.   MRN: [id number]  HPI  Here with multiple issues  Neck pain. Onset about 5 weeks ago. Woke up with stiffness right side of neck mostly. Has had some residual stiffness since then. Frequent typing with work. No injury.  Used heat and ice without much improvement. Previously used Flexeril which helped. No numbness or weakness upper extremity.  Progressive fatigue issues. Thyroid function last winter with physical normal. He is worried about possible low testosterone. Fairly normal libido and decreased stamina.  Left foot metatarsal pain. No injury. Recent change of shoes which does help slightly. Pain mostly left second metatarsal head.   Review of Systems  Constitutional: Positive for fatigue. Negative for fever, chills, appetite change and unexpected weight change.  HENT: Positive for neck pain.   Respiratory: Negative for cough and shortness of breath.   Cardiovascular: Negative for chest pain.  Musculoskeletal: Positive for back pain.       Objective:   Physical Exam  Constitutional: He appears well-developed and well-nourished.  HENT:  Mouth/Throat: Oropharynx is clear and moist.  Neck: Neck supple. No thyromegaly present.  Cardiovascular: Normal rate and regular rhythm.   Pulmonary/Chest: Effort normal and breath sounds normal. No respiratory distress. He has no wheezes. He has no rales.  Musculoskeletal:       Left foot reveals no obvious edema. He has slight tenderness head second metatarsal but no callus and no ulceration. No web space tenderness.  Full range of motion neck.  Lymphadenopathy:    He has no cervical adenopathy.  Neurological:       Full-strength upper extremities. Symmetric reflexes.          Assessment & Plan:  #1 upper back/cervical neck pain. Nonfocal neurologic exam. Flexeril 10 mg each bedtime and continue heat and/or ice and muscle massage. #2 metatarsalgia left  foot. Consider metatarsal pads. Consider orthotics if symptoms persist #3 increasing fatigue. Rule out low testosterone. Patient will return for early-morning testosterone levels

## 2011-09-04 ENCOUNTER — Other Ambulatory Visit (INDEPENDENT_AMBULATORY_CARE_PROVIDER_SITE_OTHER): Payer: 59

## 2011-09-04 DIAGNOSIS — R5383 Other fatigue: Secondary | ICD-10-CM

## 2011-09-04 DIAGNOSIS — R5381 Other malaise: Secondary | ICD-10-CM

## 2011-09-04 LAB — TESTOSTERONE: Testosterone: 147.81 ng/dL — ABNORMAL LOW (ref 350.00–890.00)

## 2011-09-06 ENCOUNTER — Other Ambulatory Visit: Payer: Self-pay | Admitting: *Deleted

## 2011-09-06 DIAGNOSIS — E349 Endocrine disorder, unspecified: Secondary | ICD-10-CM

## 2011-09-06 MED ORDER — TESTOSTERONE 20.25 MG/1.25GM (1.62%) TD GEL: 1.0000 | Freq: Every morning | TRANSDERMAL | Status: DC

## 2011-09-06 NOTE — Progress Notes (Signed)
Quick Note:  Pt informed on personally identified VM. I will also mail labs to home with instructions highlighted Future labs ordered ______

## 2011-09-07 ENCOUNTER — Telehealth: Payer: Self-pay | Admitting: Family Medicine

## 2011-09-07 NOTE — Telephone Encounter (Signed)
Medication Name: Androgel Prescriber: Evelena Peat Insurance will not cover the Androgel.  They will cover Axiron and Fortesta.  Can one of those meds be called in?  Please let pharmacist know.

## 2011-09-10 MED ORDER — TESTOSTERONE 10 MG/ACT (2%) TD GEL: 10.0000 | Freq: Every morning | TRANSDERMAL | Status: DC

## 2011-09-10 NOTE — Telephone Encounter (Signed)
Pt is going out of town [address]

## 2011-09-10 NOTE — Telephone Encounter (Signed)
May start Fortesta 40 mg (4spray actuations) once daily

## 2011-10-10 ENCOUNTER — Other Ambulatory Visit (INDEPENDENT_AMBULATORY_CARE_PROVIDER_SITE_OTHER): Payer: 59

## 2011-10-10 DIAGNOSIS — E291 Testicular hypofunction: Secondary | ICD-10-CM

## 2011-10-10 DIAGNOSIS — E349 Endocrine disorder, unspecified: Secondary | ICD-10-CM

## 2011-10-11 NOTE — Progress Notes (Signed)
Quick Note:  Pt informed on personally identified VM ______ 

## 2011-12-05 ENCOUNTER — Telehealth: Payer: Self-pay | Admitting: Family Medicine

## 2011-12-05 MED ORDER — ZOLPIDEM TARTRATE 10 MG PO TABS: 10.0000 mg | ORAL_TABLET | Freq: Every evening | ORAL | Status: DC | PRN

## 2011-12-05 NOTE — Telephone Encounter (Signed)
OK to refill

## 2011-12-05 NOTE — Telephone Encounter (Signed)
Pt called req refill of zolpidem (AMBIEN) 10 MG tablet to [facility] in [place]. Pt originally req this refill on Sunday 12/02/11. Pt said that he was in for ov in June 2013 and said that pharmacy said refill was denied. Pls call.

## 2011-12-05 NOTE — Telephone Encounter (Signed)
I do not see where zolpidem was denied by [place]? Last filled 12-23-10, #30 with 5 refills

## 2011-12-24 ENCOUNTER — Other Ambulatory Visit: Payer: Self-pay | Admitting: *Deleted

## 2011-12-24 MED ORDER — TESTOSTERONE 10 MG/ACT (2%) TD GEL: 4.0000 | Freq: Every morning | TRANSDERMAL | Status: DC

## 2012-01-02 ENCOUNTER — Encounter: Payer: Self-pay | Admitting: Family Medicine

## 2012-01-02 ENCOUNTER — Ambulatory Visit (INDEPENDENT_AMBULATORY_CARE_PROVIDER_SITE_OTHER): Payer: 59 | Admitting: Family Medicine

## 2012-01-02 VITALS — BP 120/74 | Temp 98.3°F | Wt 229.0 lb

## 2012-01-02 DIAGNOSIS — Z23 Encounter for immunization: Secondary | ICD-10-CM

## 2012-01-02 DIAGNOSIS — G4710 Hypersomnia, unspecified: Secondary | ICD-10-CM

## 2012-01-02 DIAGNOSIS — R5383 Other fatigue: Secondary | ICD-10-CM

## 2012-01-02 DIAGNOSIS — R400 Somnolence: Secondary | ICD-10-CM

## 2012-01-02 NOTE — Progress Notes (Signed)
  Subjective:    Patient ID: Timothy Harrell, male    DOB: 08/05/1955, 56 y.o.   MRN: [id number]  HPI  Patient seen with concern for possible sleep apnea. He has history of some snoring and recently had some increased problems with daytime somnolence. His girlfriend has observed that he sometimes has apnea episodes but uncertain duration. Sometimes wakes up gasping for air. He does not have any history of uncontrolled hypertension or peripheral edema issues. Has chronic insomnia issues and takes very low dose Ambien on occasion.  TSH normal last December.  Past Medical History  Diagnosis Date  . Allergy   . History of colonic polyps     hyperplastic   Past Surgical History  Procedure Date  . Vasectomy     reports that he has been smoking Cigarettes.  He has smoked for the past 2 years. He does not have any smokeless tobacco history on file. He reports that he drinks alcohol. His drug history not on file. family history includes Cancer in his father. No Known Allergies    Review of Systems  Constitutional: Positive for fatigue.  Respiratory: Negative for cough and shortness of breath.   Cardiovascular: Negative for chest pain.       Objective:   Physical Exam  Constitutional: He appears well-developed and well-nourished.  HENT:  Mouth/Throat: Oropharynx is clear and moist.  Neck: Neck supple. No thyromegaly present.  Cardiovascular: Normal rate and regular rhythm.   Pulmonary/Chest: Effort normal and breath sounds normal. No respiratory distress. He has no wheezes. He has no rales.  Musculoskeletal: He exhibits no edema.          Assessment & Plan:  Possible obstructive sleep apnea. Patient has history of snoring, ? observed apnea, daytime somnolence, and fatigue. Consider sleep study/pulmonary referral to further evaluate.  Pt agrees with this plan.

## 2012-01-29 ENCOUNTER — Institutional Professional Consult (permissible substitution): Payer: 59 | Admitting: Pulmonary Disease

## 2012-02-06 ENCOUNTER — Encounter: Payer: Self-pay | Admitting: Pulmonary Disease

## 2012-02-06 ENCOUNTER — Ambulatory Visit (INDEPENDENT_AMBULATORY_CARE_PROVIDER_SITE_OTHER): Payer: 59 | Admitting: Pulmonary Disease

## 2012-02-06 VITALS — BP 140/88 | HR 63 | Temp 98.5°F | Ht 74.0 in | Wt 228.6 lb

## 2012-02-06 DIAGNOSIS — R0989 Other specified symptoms and signs involving the circulatory and respiratory systems: Secondary | ICD-10-CM

## 2012-02-06 DIAGNOSIS — R0683 Snoring: Secondary | ICD-10-CM

## 2012-02-06 NOTE — Progress Notes (Signed)
Subjective:    Patient ID: Timothy Harrell, male    DOB: 1955-07-18, 56 y.o.   MRN: [id number]  HPI The patient is a 56 year old male who I've been asked to see for possible sleep apnea.  The patient has been noted to have intermittent snoring at night by his bed partner, but they have not mentioned an abnormal breathing pattern during sleep.  The patient denies choking arousals.  He will intermittently have frequent awakenings at night, and feels that he is under arrest at about 70% of the mornings to various degrees.  He is very active at his job, and only occasionally has sleep pressure during the day.  He has no issues with sleepiness at night watching television or movies, but can get sleepy reading.  He has no issues with sleepiness while driving.  He tells me that he has had a sleep study many years ago and was told that he was "borderline".  The patient states that his weight has been stable over the last 2 years, and his Epworth score today is abnormal at 15.  Sleep Questionnaire: What time do you typically go to bed?( Between what hours) 11pm How long does it take you to fall asleep? 15 minutes How many times during the night do you wake up? 3 What time do you get out of bed to start your day? 0600 Do you drive or operate heavy machinery in your occupation? No How much has your weight changed (up or down) over the past two years? (In pounds) 0 oz (0 kg) Have you ever had a sleep study before? Yes If yes, location of study? Patient cannot recall--[place]?? Patient was told his test was borderline and he needed to come in and redo the test and he refused-never had second test run. If yes, date of study? Patient cannot recall Do you currently use CPAP? No Do you wear oxygen at any time? No     Review of Systems  Constitutional: Negative for fever and unexpected weight change.  HENT: Positive for sore throat ( with sleeping---patients thinks he is snoring) and dental problem ( recent tooth  pulled--getting implant). Negative for ear pain, nosebleeds, congestion, rhinorrhea, sneezing, trouble swallowing, postnasal drip and sinus pressure.   Eyes: Negative for redness and itching.  Respiratory: Negative for cough, chest tightness, shortness of breath and wheezing.   Cardiovascular: Negative for palpitations and leg swelling.  Gastrointestinal: Negative for nausea and vomiting.  Genitourinary: Negative for dysuria.  Musculoskeletal: Negative for joint swelling.  Skin: Negative for rash.  Neurological: Negative for headaches.  Hematological: Does not bruise/bleed easily.  Psychiatric/Behavioral: Negative for dysphoric mood. The patient is not nervous/anxious.        Objective:   Physical Exam Constitutional:  Well developed, no acute distress  HENT:  Nares patent without discharge  Oropharynx without exudate, palate and uvula are elongated  Eyes:  Perrla, eomi, no scleral icterus  Neck:  No JVD, no TMG  Cardiovascular:  Normal rate, regular rhythm, no rubs or gallops.  No murmurs        Intact distal pulses  Pulmonary :  Normal breath sounds, no stridor or respiratory distress   No rales, rhonchi, or wheezing  Abdominal:  Soft, nondistended, bowel sounds present.  No tenderness noted.   Musculoskeletal:  No lower extremity edema noted.  Lymph Nodes:  No cervical lymphadenopathy noted  Skin:  No cyanosis noted  Neurologic:  Alert, appropriate, moves all 4 extremities without obvious deficit.  Assessment & Plan:

## 2012-02-06 NOTE — Assessment & Plan Note (Signed)
Based on the patient's history, I suspect that he has symptomatic snoring or at the worst very mild sleep apnea.  I do not think this is a health issue for him at this time.  I have explained that we cannot differentiate between these conditions without a sleep study, and I do think he would be a good candidate for home sleep testing.  He and I have had a long discussion about how to proceed, and he would like to take the next 6 months to work aggressively on weight loss.  If he does not see improvement in his symptoms, or if he decides to purchase more aggressively, he will call and I can set him up for a sleep study.  He is to check in with me at 6 months even if he is making progress with weight loss.

## 2012-02-06 NOTE — Patient Instructions (Addendum)
Work on weight reduction over the next 6 mos.  Please check in at that time with an update.  If you feel you are not making progress, will schedule for home sleep test at that time.

## 2012-05-20 ENCOUNTER — Other Ambulatory Visit: Payer: Self-pay | Admitting: Family Medicine

## 2012-05-20 NOTE — Telephone Encounter (Signed)
Timothy Harrell last filled 12-24-11, #60 refill 2

## 2012-05-20 NOTE — Telephone Encounter (Signed)
Refill OK for 3 months. 

## 2012-07-25 ENCOUNTER — Other Ambulatory Visit: Payer: Self-pay | Admitting: Family Medicine

## 2012-08-15 ENCOUNTER — Other Ambulatory Visit: Payer: Self-pay | Admitting: Family Medicine

## 2012-08-15 NOTE — Telephone Encounter (Signed)
Refill OK for 6 months. 

## 2012-09-28 ENCOUNTER — Other Ambulatory Visit: Payer: Self-pay | Admitting: Family Medicine

## 2012-09-29 NOTE — Telephone Encounter (Signed)
Last refill on 05/20/12  60 g 2 refills

## 2012-09-29 NOTE — Telephone Encounter (Signed)
Refill OK for 6 months. 

## 2012-09-30 NOTE — Addendum Note (Signed)
Addended by: Thomasena Edis on: 09/30/2012 09:55 AM   Modules accepted: Orders

## 2012-11-03 ENCOUNTER — Other Ambulatory Visit: Payer: Self-pay | Admitting: Family Medicine

## 2012-12-09 ENCOUNTER — Other Ambulatory Visit: Payer: Self-pay | Admitting: Family Medicine

## 2012-12-09 NOTE — Telephone Encounter (Signed)
Last visit 01/02/12 Last refill 08/15/12 #30 5 refill

## 2012-12-09 NOTE — Telephone Encounter (Signed)
Refill once 

## 2012-12-10 NOTE — Telephone Encounter (Signed)
Pt needs rx to go to [facility] [address] in [place]

## 2012-12-10 NOTE — Telephone Encounter (Signed)
Called in RX 

## 2012-12-25 ENCOUNTER — Other Ambulatory Visit (INDEPENDENT_AMBULATORY_CARE_PROVIDER_SITE_OTHER): Payer: 59

## 2012-12-25 DIAGNOSIS — Z0000 Encounter for general adult medical examination without abnormal findings: Secondary | ICD-10-CM

## 2012-12-25 LAB — BASIC METABOLIC PANEL
BUN: 20 mg/dL (ref 6–23)
Chloride: 102 mEq/L (ref 96–112)
Potassium: 5 mEq/L (ref 3.5–5.1)

## 2012-12-25 LAB — POCT URINALYSIS DIPSTICK
Blood, UA: NEGATIVE
Nitrite, UA: NEGATIVE
Urobilinogen, UA: 0.2
pH, UA: 6

## 2012-12-25 LAB — HEPATIC FUNCTION PANEL
ALT: 35 U/L (ref 0–53)
Albumin: 4.3 g/dL (ref 3.5–5.2)
Total Bilirubin: 0.9 mg/dL (ref 0.3–1.2)
Total Protein: 7 g/dL (ref 6.0–8.3)

## 2012-12-25 LAB — CBC WITH DIFFERENTIAL/PLATELET
Basophils Relative: 0.5 % (ref 0.0–3.0)
Eosinophils Relative: 1.1 % (ref 0.0–5.0)
HCT: 46.8 % (ref 39.0–52.0)
Lymphs Abs: 1.7 10*3/uL (ref 0.7–4.0)
MCV: 88.6 fl (ref 78.0–100.0)
Monocytes Absolute: 0.5 10*3/uL (ref 0.1–1.0)
Platelets: 242 10*3/uL (ref 150.0–400.0)
WBC: 6 10*3/uL (ref 4.5–10.5)

## 2012-12-25 LAB — LIPID PANEL: Total CHOL/HDL Ratio: 4

## 2012-12-25 LAB — PSA: PSA: 3.01 ng/mL (ref 0.10–4.00)

## 2012-12-25 LAB — TSH: TSH: 1.35 u[IU]/mL (ref 0.35–5.50)

## 2013-01-01 ENCOUNTER — Encounter: Payer: 59 | Admitting: Family Medicine

## 2013-01-29 ENCOUNTER — Encounter: Payer: Self-pay | Admitting: *Deleted

## 2013-01-30 ENCOUNTER — Ambulatory Visit (INDEPENDENT_AMBULATORY_CARE_PROVIDER_SITE_OTHER): Payer: 59 | Admitting: Family Medicine

## 2013-01-30 ENCOUNTER — Encounter: Payer: Self-pay | Admitting: Family Medicine

## 2013-01-30 VITALS — BP 130/80 | HR 82 | Temp 98.0°F | Ht 74.0 in | Wt 234.0 lb

## 2013-01-30 DIAGNOSIS — Z0000 Encounter for general adult medical examination without abnormal findings: Secondary | ICD-10-CM

## 2013-01-30 DIAGNOSIS — Z8042 Family history of malignant neoplasm of prostate: Secondary | ICD-10-CM

## 2013-01-30 MED ORDER — FLUTICASONE PROPIONATE 50 MCG/ACT NA SUSP: 2.0000 | Freq: Every day | NASAL | Status: DC

## 2013-01-30 NOTE — Progress Notes (Signed)
  Subjective:    Patient ID: Timothy Harrell, male    DOB: 1955-10-25, 57 y.o.   MRN: [id number]  HPI  Patient seen for complete physical He has history of some chronic allergies and has had frequent nasal congestion which he relates to changes in barometric pressure and weather. He has frequent nasal stuffiness at night. He's tried over-the-counter decongestants without much improvement.  He takes Cialis intermittently for erectile dysfunction. He also has some chronic intermittent insomnia takes Ambien for that as needed. Patient is nonsmoker. No consistent exercise.  Family history significant for father with prostate cancer  Past Medical History  Diagnosis Date  . Allergy   . History of colonic polyps     hyperplastic   Past Surgical History  Procedure Laterality Date  . Vasectomy      reports that he quit smoking about 13 years ago. His smoking use included Cigarettes and Cigars. He has a 5 pack-year smoking history. He does not have any smokeless tobacco history on file. He reports that he drinks alcohol. He reports that he does not use illicit drugs. family history includes Cancer in his father. No Known Allergies   Review of Systems  Constitutional: Negative for fever, activity change, appetite change and fatigue.  HENT: Negative for congestion, ear pain and trouble swallowing.   Eyes: Negative for pain and visual disturbance.  Respiratory: Negative for cough, shortness of breath and wheezing.   Cardiovascular: Negative for chest pain and palpitations.  Gastrointestinal: Negative for nausea, vomiting, abdominal pain, diarrhea, constipation, blood in stool, abdominal distention and rectal pain.  Endocrine: Negative for polydipsia and polyuria.  Genitourinary: Negative for dysuria, hematuria and testicular pain.  Musculoskeletal: Negative for arthralgias and joint swelling.  Skin: Negative for rash.  Neurological: Negative for dizziness, syncope and headaches.   Hematological: Negative for adenopathy.  Psychiatric/Behavioral: Negative for confusion and dysphoric mood.       Objective:   Physical Exam  Constitutional: He is oriented to person, place, and time. He appears well-developed and well-nourished. No distress.  HENT:  Head: Normocephalic and atraumatic.  Right Ear: External ear normal.  Left Ear: External ear normal.  Mouth/Throat: Oropharynx is clear and moist.  Eyes: Conjunctivae and EOM are normal. Pupils are equal, round, and reactive to light.  Neck: Normal range of motion. Neck supple. No thyromegaly present.  Cardiovascular: Normal rate, regular rhythm and normal heart sounds.   No murmur heard. Pulmonary/Chest: No respiratory distress. He has no wheezes. He has no rales.  Abdominal: Soft. Bowel sounds are normal. He exhibits no distension and no mass. There is no tenderness. There is no rebound and no guarding.  Genitourinary: Prostate normal. No penile tenderness.  Musculoskeletal: He exhibits no edema.  Lymphadenopathy:    He has no cervical adenopathy.  Neurological: He is alert and oriented to person, place, and time. He displays normal reflexes. No cranial nerve deficit.  Skin: No rash noted.  Psychiatric: He has a normal mood and affect.          Assessment & Plan:  Complete physical. Labs reviewed. He has normal PSA but this has been slowly increasing somewhat over the past couple years currently 3.01. Recheck PSA in 4 months. If still increasing at that time consider urology referral. Regarding nasal congestion prescribed Flonase nasal

## 2013-01-30 NOTE — Patient Instructions (Signed)
Schedule repeat PSA in 4 months.

## 2013-01-30 NOTE — Progress Notes (Signed)
Pre visit review using our clinic review tool, if applicable. No additional management support is needed unless otherwise documented below in the visit note. 

## 2013-01-31 DIAGNOSIS — E669 Obesity, unspecified: Secondary | ICD-10-CM | POA: Insufficient documentation

## 2013-02-16 ENCOUNTER — Other Ambulatory Visit: Payer: Self-pay | Admitting: Family Medicine

## 2013-02-17 NOTE — Telephone Encounter (Signed)
Refill OK

## 2013-02-17 NOTE — Telephone Encounter (Signed)
Last refill 12/24/2011 60g with 2 refills

## 2013-02-23 ENCOUNTER — Encounter: Payer: Self-pay | Admitting: *Deleted

## 2013-02-24 ENCOUNTER — Encounter: Payer: Self-pay | Admitting: Family Medicine

## 2013-02-24 ENCOUNTER — Ambulatory Visit (INDEPENDENT_AMBULATORY_CARE_PROVIDER_SITE_OTHER): Payer: 59 | Admitting: Family Medicine

## 2013-02-24 VITALS — BP 130/78 | HR 75 | Temp 98.5°F | Wt 230.0 lb

## 2013-02-24 DIAGNOSIS — F5104 Psychophysiologic insomnia: Secondary | ICD-10-CM

## 2013-02-24 DIAGNOSIS — G4700 Insomnia, unspecified: Secondary | ICD-10-CM

## 2013-02-24 DIAGNOSIS — J3489 Other specified disorders of nose and nasal sinuses: Secondary | ICD-10-CM

## 2013-02-24 MED ORDER — TADALAFIL 20 MG PO TABS: ORAL_TABLET | ORAL | Status: DC

## 2013-02-24 MED ORDER — IPRATROPIUM BROMIDE 0.03 % NA SOLN: 2.0000 | Freq: Two times a day (BID) | NASAL | Status: DC

## 2013-02-24 NOTE — Progress Notes (Signed)
   Subjective:    Patient ID: Timothy Harrell, male    DOB: 01/11/1956, 57 y.o.   MRN: [id number]  HPI Patient here to assess the following issues  Recent physical. PSA low 3 range. Slowly increasing over the past few years. Positive family history of prostate cancer in father. He denies any urinary symptoms. No obstructive symptoms. Patient requesting referral to urologist. We initially planned on repeating PSA in about 4 months.  We also gave option of urology referral and he prefers the latter  Chronic insomnia. Mostly difficulty falling asleep. He does have some daytime somnolence. Apparently has some increased snoring. Requesting referral to neurologist. No depression issues. Takes very low dose Ambien less than one half tablet occasionally at night which does seem to help. We have discussed sleep hygiene in the past.  Frequent rhinorrhea at night. Recent trial Flonase has not helped. Denies any sneezing. He has more rhinorrhea than nasal congestion.  Past Medical History  Diagnosis Date  . Allergy   . History of colonic polyps     hyperplastic   Past Surgical History  Procedure Laterality Date  . Vasectomy      reports that he quit smoking about 13 years ago. His smoking use included Cigarettes and Cigars. He has a 5 pack-year smoking history. He does not have any smokeless tobacco history on file. He reports that he drinks alcohol. He reports that he does not use illicit drugs. family history includes Cancer in his father. No Known Allergies    Review of Systems  Constitutional: Negative for fever and chills.  Genitourinary: Negative for dysuria, frequency and difficulty urinating.       Objective:   Physical Exam  Constitutional: He appears well-developed and well-nourished.  Cardiovascular: Normal rate and regular rhythm.           Assessment & Plan:  #1 elevating PSA. Still within normal range but he's had increase gradually over the past few years. Positive  family history of prostate cancer in his father. Patient requesting referral to urologist. Already has set up appointment for January 7 #2 chronic insomnia. Patient requesting referral to neurologist for further evaluation. He specifically gives the name of neurologist in [facility] who has expertise with sleep disorders. We'll set up. #3 chronic rhinorrhea. Question component of non-allergic rhinitis. No improvement with Flonase. Frequent postnasal drip symptoms at night. No relief with antihistamines. Trial of Atrovent nasal 0.03%

## 2013-02-24 NOTE — Progress Notes (Signed)
Pre visit review using our clinic review tool, if applicable. No additional management support is needed unless otherwise documented below in the visit note. 

## 2013-03-31 ENCOUNTER — Other Ambulatory Visit: Payer: Self-pay | Admitting: Family Medicine

## 2013-03-31 NOTE — Telephone Encounter (Signed)
Last visit 02-24-13 Last refill 02/16/13 60g no refill

## 2013-03-31 NOTE — Telephone Encounter (Signed)
Refill OK.  Confirm if he has seen urologist yet for borderline high PSA

## 2013-04-01 NOTE — Telephone Encounter (Signed)
Pt confirmed that he has been to see his urologist doctor. RX sent to pharmacy

## 2013-04-03 ENCOUNTER — Telehealth: Payer: Self-pay | Admitting: Family Medicine

## 2013-04-03 NOTE — Telephone Encounter (Signed)
Rec'd from [facility] forward 3 pages to [place]

## 2013-04-12 ENCOUNTER — Other Ambulatory Visit: Payer: Self-pay | Admitting: Family Medicine

## 2013-04-13 NOTE — Telephone Encounter (Signed)
Refill OK

## 2013-04-13 NOTE — Telephone Encounter (Signed)
Last visit 02/24/13 Last refill 12/09/12 #30 0 refills

## 2013-04-22 DIAGNOSIS — C61 Malignant neoplasm of prostate: Secondary | ICD-10-CM

## 2013-04-22 HISTORY — DX: Malignant neoplasm of prostate: C61

## 2013-04-22 HISTORY — PX: PROSTATE BIOPSY: SHX241

## 2013-04-29 ENCOUNTER — Telehealth: Payer: Self-pay | Admitting: Family Medicine

## 2013-04-29 NOTE — Telephone Encounter (Signed)
Pt states he was recently diagnosed with prostate cancer and is requesting a call back from Dr. Elease Hashimoto at his convenience

## 2013-05-04 NOTE — Telephone Encounter (Signed)
I tried calling this patient couple of times.  Left message last week.  See if there is a good time for me to talk with him.

## 2013-05-05 NOTE — Telephone Encounter (Signed)
Spoke to pt asked him what is a good time for Dr. Elease Hashimoto to call him. Pt said after 5:30 at [phone]. Told pt okay will let him know.

## 2013-05-25 ENCOUNTER — Encounter: Payer: Self-pay | Admitting: *Deleted

## 2013-05-26 ENCOUNTER — Encounter: Payer: Self-pay | Admitting: Radiation Oncology

## 2013-05-26 NOTE — Progress Notes (Signed)
GU Location of Tumor / Histology: prostate  If Prostate Cancer, Gleason Score is (3 + 3) and PSA is (3.01 on 12/2012) 2010 PSA 0.93  Patient presented 3 months ago with signs/symptoms of: rising PSA, positive family history of prostate cancer in father  Biopsies of prostate (if applicable) revealed: adenocarcinoma, 2 /12 cores, Gleason 3+3=6, volume 30 ml  Past/Anticipated interventions by urology, if any: surveillance of PSA  Past/Anticipated interventions by medical oncology, if any: none  Weight changes, if any: no  Bowel/Bladder complaints, if any:  Urinary frequency, some issue w/stream starting/stopping and urgency, weak stream, nocturia x 1,  IPSS 7  Nausea/Vomiting, if any: no  Pain issues, if any:  no  SAFETY ISSUES:  Prior radiation? no  Pacemaker/ICD? no  Possible current pregnancy? NA  Is the patient on methotrexate? 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 Pt is interested in brachytherapy.

## 2013-05-27 ENCOUNTER — Ambulatory Visit
Admission: RE | Admit: 2013-05-27 | Discharge: 2013-05-27 | Disposition: A | Discharge status: Home / Self Care | Payer: 59 | Source: Ambulatory Visit | Attending: Radiation Oncology | Admitting: Radiation Oncology

## 2013-05-27 ENCOUNTER — Encounter: Payer: Self-pay | Admitting: Radiation Oncology

## 2013-05-27 VITALS — BP 141/79 | HR 77 | Temp 98.2°F | Resp 20 | Ht 74.0 in | Wt 233.7 lb

## 2013-05-27 DIAGNOSIS — C61 Malignant neoplasm of prostate: Secondary | ICD-10-CM | POA: Insufficient documentation

## 2013-05-27 DIAGNOSIS — Z860100 Personal history of colon polyps, unspecified: Secondary | ICD-10-CM | POA: Insufficient documentation

## 2013-05-27 DIAGNOSIS — Z79899 Other long term (current) drug therapy: Secondary | ICD-10-CM | POA: Insufficient documentation

## 2013-05-27 HISTORY — DX: Male erectile dysfunction, unspecified: N52.9

## 2013-05-27 HISTORY — DX: Insomnia, unspecified: G47.00

## 2013-05-27 NOTE — Progress Notes (Signed)
Please see the Nurse Progress Note in the MD Initial Consult Encounter for this patient. 

## 2013-05-27 NOTE — Progress Notes (Signed)
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 male who is seen today through the courtesy of Dr. Diona Fanti of for evaluation of his stageT1c favorable risk adenocarcinoma prostate. His PSA was 0.93 back in 2010. It crept up to 3.01 by late 2014 and the patient was seen in consultation by Dr. Diona Fanti. Of note is that he was on androgen replacement therapy for approximately one half years for hypogonadism. He is also taking Cialis for erectile dysfunction with satisfactory results. He stopped his androgen replacement therapy following his diagnosis approximately 1 month ago. Dr. Diona Fanti performed ultrasound-guided biopsies on 04/22/2013. He was found have Gleason 6 (3+3) involving 10% of one core from the left lateral mid gland and 5% of one core from the left apex. His gland volume was approximately 30 cc. He is doing well from a GU and GI standpoint. His I PSS score today is 7.  PREVIOUS RADIATION THERAPY: No   PAST MEDICAL HISTORY:  has a past medical history of Allergy; History of colonic polyps; Prostate cancer (04/22/13); ED (erectile dysfunction) of organic origin; and Insomnia.     PAST SURGICAL HISTORY:  Past Surgical History  Procedure Laterality Date  . Vasectomy    . Prostate biopsy  04/22/2013    Gleason 6     FAMILY HISTORY: family history includes Cancer in his father; Heart attack in his father. As follows diagnosed with prostate cancer in his 51s and died from a heart attack at 15.   SOCIAL HISTORY:  reports that he quit smoking about 14 years ago. His smoking use included Cigarettes and Cigars. He has a 7.5 pack-year smoking  history. He has never used smokeless tobacco. He reports that he drinks alcohol. He reports that he does not use illicit drugs. Divorced, 5 children. He works as a Freight forwarder at [employer]: Review of patient's allergies indicates no known allergies.   MEDICATIONS:  Current Outpatient Prescriptions  Medication Sig Dispense Refill  . ipratropium (ATROVENT) 0.03 % nasal spray Place 2 sprays into both nostrils every 12 (twelve) hours.  30 mL  12  . Multiple Vitamins-Minerals (MULTIVITAMIN WITH MINERALS) tablet Take 1 tablet by mouth daily.        . tadalafil (CIALIS) 20 MG tablet Every other day prn  10 tablet  6  . zolpidem (AMBIEN) 10 MG tablet TAKE 1 TABLET BY MOUTH EVERY NIGHT AT BEDTIME AS NEEDED FOR SLEEP  30 tablet  0   No current facility-administered medications for this encounter.     REVIEW OF SYSTEMS:  Pertinent items are noted in HPI.    PHYSICAL EXAM:  height is 6\' 2"  (1.88 m) and weight is 233 lb 11.2 oz (106.006 kg). His oral temperature is 98.2 F (36.8 C). His blood pressure is 141/79 and his pulse is 77. His respiration is 20.   Alert and oriented. Head and neck examination: Grossly unremarkable. Nodes: Without palpable cervical or supraclavicular lymphadenopathy. Chest: Lungs clear. Back: Without spinal or CVA tenderness. Abdomen: Without masses organomegaly. Genitalia: Unremarkable to inspection. Rectal: The prostate gland is normal in size and is without focal induration  or nodularity. Seminal vesicles are not palpable. Extremities: Without edema.   LABORATORY DATA:  Lab Results  Component Value Date   WBC 6.0 12/25/2012   HGB 15.8 12/25/2012   HCT 46.8 12/25/2012   MCV 88.6 12/25/2012   PLT 242.0 12/25/2012   Lab Results  Component Value Date   NA 139 12/25/2012   K 5.0 12/25/2012   CL 102 12/25/2012   CO2 29 12/25/2012   Lab Results  Component Value Date   ALT 35 12/25/2012   AST 19 12/25/2012   ALKPHOS 55 12/25/2012   BILITOT 0.9  12/25/2012   PSA 3.01 from 12/25/2012   IMPRESSION: StageT1c favorable risk adenocarcinoma prostate. I explained to the patient, his significant other, and 2 children that his prognosis is related to his stage, Gleason score, and PSA level. All are favorable. Other prognostic factors include PSA doubling time and disease volume, and these also favorable. Management options include robotic surgery versus close surveillance/observation versus radiation therapy. Radiation therapy options include seed implantation alone or 8 weeks of external beam/IMRT. He has close to a 30  year  life expectancy, thus he probably will need to  consider curative therapy during his lifetime. He could certainly consider observation/close surveillance with serial PSA determinations and repeat biopsies plus or minus MRI scans, but other hand his chance for cure will be know greater than it is now. We discussed the potential acute and late toxicities of seed implantation and external beam/IMRT. Considering his relatively young age, I favor surgery which is extremely well tolerated, and provides a pathological stage with determination of remission much sooner than being treated with radiation therapy. Radiation therapy can be expected to be just as curative as surgery, but there can be late complications. He is in favor of speaking with one of the robotic surgeons, and this can be arranged through Dr. Diona Fanti. He was given literature for review.   PLAN: As discussed above. Dr. Diona Fanti can arrange consultation with one of the robotic surgeons. If he is not in favor of surgery, then I would certainly consider seed implantation.  I spent 60 minutes minutes face to face with the patient and more than 50% of that time was spent in counseling and/or coordination of care.

## 2013-05-29 ENCOUNTER — Other Ambulatory Visit: Payer: 59

## 2013-05-29 ENCOUNTER — Ambulatory Visit: Payer: 59 | Admitting: Family Medicine

## 2013-06-02 ENCOUNTER — Other Ambulatory Visit: Payer: Self-pay | Admitting: Urology

## 2013-06-12 ENCOUNTER — Encounter (HOSPITAL_COMMUNITY): Payer: Self-pay | Admitting: Pharmacy Technician

## 2013-06-18 ENCOUNTER — Other Ambulatory Visit (HOSPITAL_COMMUNITY): Payer: Self-pay | Admitting: *Deleted

## 2013-06-18 NOTE — Patient Instructions (Addendum)
[facility]  06/18/2013   Your procedure is scheduled on: Monday April 20th  Report to [facility] at 515 AM.  Call this number if you have problems the morning of surgery [phone]   Remember: follow all bowel prep instructions from dr borden, short stay will draw your blood type morning of surgery. BRING CPAP MASK AND TUBING  Do not eat food :After Midnight Saturday night, clear liquids all day Sunday 06-19-13, no clear liquids after midnight Sunday night.     Take these medicines the morning of surgery with A SIP OF WATER: zyrtec                               You may not have any metal on your body including hair pins and piercings  Do not wear jewelry, make-up, lotions, powders, or deodorant.   Men may shave face and neck.  Do not bring valuables to the hospital. [place].  Contacts, dentures or bridgework may not be worn into surgery.  Leave suitcase in the car. After surgery it may be brought to your room.  For patients admitted to the hospital, checkout time is 11:00 AM the day of discharge.   [facility] - Preparing for Surgery Before surgery, you can play an important role.  Because skin is not sterile, your skin needs to be as free of germs as possible.  You can reduce the number of germs on your skin by washing with CHG (chlorahexidine gluconate) soap before surgery.  CHG is an antiseptic cleaner which kills germs and bonds with the skin to continue killing germs even after washing. Please DO NOT use if you have an allergy to CHG or antibacterial soaps.  If your skin becomes reddened/irritated stop using the CHG and inform your nurse when you arrive at Short Stay. Do not shave (including legs and underarms) for at least 48 hours prior to the first CHG shower.  You may shave your face. Please follow these instructions carefully:  1.  Shower with CHG Soap the night before surgery and the  morning of Surgery.  2.  If you  choose to wash your hair, wash your hair first as usual with your  normal  shampoo.  3.  After you shampoo, rinse your hair and body thoroughly to remove the  shampoo.                           4.  Use CHG as you would any other liquid soap.  You can apply chg directly  to the skin and wash                       Gently with a scrungie or clean washcloth.  5.  Apply the CHG Soap to your body ONLY FROM THE NECK DOWN.   Do not use on open                           Wound or open sores. Avoid contact with eyes, ears mouth and genitals (private parts).                        Genitals (private parts) with your normal soap.             6.  Wash thoroughly, paying special attention to the area where your surgery  will be performed.  7.  Thoroughly rinse your body with warm water from the neck down.  8.  DO NOT shower/wash with your normal soap after using and rinsing off  the CHG Soap.                9.  Pat yourself dry with a clean towel.            10.  Wear clean pajamas.            11.  Place clean sheets on your bed the night of your first shower and do not  sleep with pets. Day of Surgery : Do not apply any lotions/deodorants the morning of surgery.  Please wear clean clothes to the hospital/surgery center.  FAILURE TO FOLLOW THESE INSTRUCTIONS MAY RESULT IN THE CANCELLATION OF YOUR SURGERY PATIENT SIGNATURE_________________________________  NURSE SIGNATURE__________________________________    CLEAR LIQUID DIET   Foods Allowed                                                                     Foods Excluded  Coffee and tea, regular and decaf                             liquids that you cannot  Plain Jell-O in any flavor                                             see through such as: Fruit ices (not with fruit pulp)                                     milk, soups, orange juice  Iced Popsicles                                    All solid food Carbonated beverages, regular and diet                                     Cranberry, grape and apple juices Sports drinks like Gatorade Lightly seasoned clear broth or consume(fat free) Sugar, honey syrup  Sample Menu Breakfast                                Lunch                                     Supper Cranberry juice                    Beef broth  Chicken broth Jell-O                                     Grape juice                           Apple juice Coffee or tea                        Jell-O                                      Popsicle                                                Coffee or tea                        Coffee or tea Incentive Spirometer  An incentive spirometer is a tool that can help keep your lungs clear and active. This tool measures how well you are filling your lungs with each breath. Taking long deep breaths may help reverse or decrease the chance of developing breathing (pulmonary) problems (especially infection) following:  A long period of time when you are unable to move or be active. BEFORE THE PROCEDURE   If the spirometer includes an indicator to show your best effort, your nurse or respiratory therapist will set it to a desired goal.  If possible, sit up straight or lean slightly forward. Try not to slouch.  Hold the incentive spirometer in an upright position. INSTRUCTIONS FOR USE  1. Sit on the edge of your bed if possible, or sit up as far as you can in bed or on a chair. 2. Hold the incentive spirometer in an upright position. 3. Breathe out normally. 4. Place the mouthpiece in your mouth and seal your lips tightly around it. 5. Breathe in slowly and as deeply as possible, raising the piston or the ball toward the top of the column. 6. Hold your breath for 3-5 seconds or for as long as possible. Allow the piston or ball to fall to the bottom of the column. 7. Remove the mouthpiece from your mouth and breathe out normally. 8. Rest for a few seconds and repeat  Steps 1 through 7 at least 10 times every 1-2 hours when you are awake. Take your time and take a few normal breaths between deep breaths. 9. The spirometer may include an indicator to show your best effort. Use the indicator as a goal to work toward during each repetition. 10. After each set of 10 deep breaths, practice coughing to be sure your lungs are clear. If you have an incision (the cut made at the time of surgery), support your incision when coughing by placing a pillow or rolled up towels firmly against it. Once you are able to get out of bed, walk around indoors and cough well. You may stop using the incentive spirometer when instructed by your caregiver.  RISKS AND COMPLICATIONS  Take your time so you do not get dizzy or light-headed.  If you are in pain, you may need to take or  ask for pain medication before doing incentive spirometry. It is harder to take a deep breath if you are having pain. AFTER USE  Rest and breathe slowly and easily.  It can be helpful to keep track of a log of your progress. Your caregiver can provide you with a simple table to help with this. If you are using the spirometer at home, follow these instructions: [place] IF:   You are having difficultly using the spirometer.  You have trouble using the spirometer as often as instructed.  Your pain medication is not giving enough relief while using the spirometer.  You develop fever of 100.5 F (38.1 C) or higher. SEEK IMMEDIATE MEDICAL CARE IF:   You cough up bloody sputum that had not been present before.  You develop fever of 102 F (38.9 C) or greater.  You develop worsening pain at or near the incision site. MAKE SURE YOU:   Understand these instructions.  Will watch your condition.  Will get help right away if you are not doing well or get worse. Document Released: 07/09/2006 Document Revised: 05/21/2011 Document Reviewed: 09/09/2006 [facility] Patient Information 2014  [place], [place].

## 2013-06-19 ENCOUNTER — Ambulatory Visit (HOSPITAL_COMMUNITY)
Admission: RE | Admit: 2013-06-19 | Discharge: 2013-06-19 | Disposition: A | Discharge status: Home / Self Care | Payer: 59 | Source: Ambulatory Visit | Attending: Urology | Admitting: Urology

## 2013-06-19 ENCOUNTER — Encounter (HOSPITAL_COMMUNITY)
Admission: RE | Admit: 2013-06-19 | Discharge: 2013-06-19 | Disposition: A | Discharge status: Home / Self Care | Payer: 59 | Source: Ambulatory Visit | Attending: Urology | Admitting: Urology

## 2013-06-19 ENCOUNTER — Encounter (HOSPITAL_COMMUNITY): Payer: Self-pay

## 2013-06-19 DIAGNOSIS — Z01810 Encounter for preprocedural cardiovascular examination: Secondary | ICD-10-CM | POA: Insufficient documentation

## 2013-06-19 DIAGNOSIS — Z01812 Encounter for preprocedural laboratory examination: Secondary | ICD-10-CM | POA: Insufficient documentation

## 2013-06-19 DIAGNOSIS — Z01818 Encounter for other preprocedural examination: Secondary | ICD-10-CM | POA: Insufficient documentation

## 2013-06-19 HISTORY — DX: Sleep apnea, unspecified: G47.30

## 2013-06-19 LAB — BASIC METABOLIC PANEL
BUN: 18 mg/dL (ref 6–23)
CHLORIDE: 104 meq/L (ref 96–112)
CO2: 26 mEq/L (ref 19–32)
CREATININE: 1.1 mg/dL (ref 0.50–1.35)
Calcium: 9.5 mg/dL (ref 8.4–10.5)
GFR calc non Af Amer: 73 mL/min — ABNORMAL LOW (ref >90)
GFR, EST AFRICAN AMERICAN: 85 mL/min — AB (ref >90)
Glucose, Bld: 90 mg/dL (ref 70–99)
Potassium: 4.5 mEq/L (ref 3.7–5.3)
SODIUM: 141 meq/L (ref 137–147)

## 2013-06-19 LAB — CBC
HCT: 42.7 % (ref 39.0–52.0)
Hemoglobin: 14.4 g/dL (ref 13.0–17.0)
MCH: 29.4 pg (ref 26.0–34.0)
MCHC: 33.7 g/dL (ref 30.0–36.0)
MCV: 87.1 fL (ref 78.0–100.0)
PLATELETS: 200 10*3/uL (ref 150–400)
RBC: 4.9 MIL/uL (ref 4.22–5.81)
RDW: 12.4 % (ref 11.5–15.5)
WBC: 4.8 10*3/uL (ref 4.0–10.5)

## 2013-06-26 NOTE — H&P (Signed)
  Chief Complaint Prostate Cancer     History of Present Illness Mr. Chelf is a 58 year old gentleman who was being treated with [place] for testosterone deficiency and symptoms including decreased libido, decreased energy, and erectile dysfunction over the past few years. His symptoms were significantly improved on therapy. I do not have records that include his baseline testosterone level. He recently was diagnosed with OSA and is now using CPAP and feels that his energy level is greatly improved even off testosterone. His baseline PSA was 0.93 about 4 years ago and has been steadily increasing the last few years up to 3.01 a couple of months ago. This prompted urologic evaluation and a prostate needle biopsy by Dr. Diona Fanti on 04/22/13 which confirmed Gleason 3+3=6 adenocarcinoma in 2 out of 12 biopsy cores. He does have a paternal family history of prostate cancer.   He has been thoroughly counseled by Dr. Diona Fanti and Dr. Valere Dross regarding his options for management/treatment. He has elected to proceed with surgical therapy.   TNM stage: cT1c Nx Mx PSA: 3.01 Gleason score: 3+3=6 Biopsy (04/22/13): 2/12 cores positive - L apex (5%), L lateral mid (10%) Prostate volume: 30.7 cc PSAD: 0.10  Nomogram OC disease: 84% EPE: 12% SVI: 1% LNI: 1% PFS (surgery): 96% at 5 years, 93% at 10 years  Urinary function: IPSS is 5. Erectile function: SHIM score is 25 although this is with taking a small dose of Cialis prn.   Past Medical History Problems  1. History of sleep apnea (V13.89)  Surgical History Problems  1. History of Surgery Of Male Genitalia Vasectomy  Current Meds 1. Cialis TABS;  Therapy: ([id number]) to Recorded 2. Ipratropium Bromide SOLN;  Therapy: (Recorded:[id number]) to Recorded 3. Multi-Vitamin Oral Tablet;  Therapy: ([id number]) to Recorded 4. Zolpidem Tartrate TABS;  Therapy: ([id number]) to Recorded  Allergies Medication  1. No  Known Drug Allergies  Family History Problems  1. Family history of Death of family member : Father   Father deceased at age 21; systemic- MI 2. Family history of prostate cancer (B63.84) : Father  Social History Problems    Alcohol use   1/week   Former smoker [place])   smoked for 15 yrs & quit in 2012   Occupation   Freight forwarder, [employer]  Review of Systems Constitutional, skin, eye, otolaryngeal, hematologic/lymphatic, cardiovascular, pulmonary, endocrine, musculoskeletal, gastrointestinal, neurological and psychiatric system(s) were reviewed and pertinent findings if present are noted.    Vitals  Height: 6 ft 2 in Weight: 220 lb  BMI Calculated: 28.25 BSA Calculated: 2.26   Physical Exam Constitutional: Well nourished and well developed . No acute distress.  ENT:. The ears and nose are normal in appearance.  Neck: The appearance of the neck is normal and no neck mass is present.  Pulmonary: No respiratory distress, normal respiratory rhythm and effort and clear bilateral breath sounds.  Cardiovascular: Heart rate and rhythm are normal . No peripheral edema.  Abdomen: The abdomen is soft and nontender. No masses are palpated. No CVA tenderness. No hernias are palpable. No hepatosplenomegaly noted.    Assessment Assessed  1. Adenocarcinoma of prostate (185)   Discussion/Summary 1. Prostate cancer:  He does wish to proceed with surgical therapy and will be scheduled for a bilateral nerve sparing RAL radical prostatectomy.

## 2013-06-29 ENCOUNTER — Inpatient Hospital Stay (HOSPITAL_COMMUNITY)
Admission: RE | Admit: 2013-06-29 | Discharge: 2013-06-30 | DRG: 708 | Disposition: A | Discharge status: Home / Self Care | Payer: 59 | Source: Ambulatory Visit | Attending: Urology | Admitting: Urology

## 2013-06-29 ENCOUNTER — Encounter (HOSPITAL_COMMUNITY)
Admission: RE | Disposition: A | Discharge status: Home / Self Care | Payer: Self-pay | Source: Ambulatory Visit | Attending: Urology

## 2013-06-29 ENCOUNTER — Encounter (HOSPITAL_COMMUNITY): Payer: 59 | Admitting: Anesthesiology

## 2013-06-29 ENCOUNTER — Inpatient Hospital Stay (HOSPITAL_COMMUNITY): Payer: 59 | Admitting: Anesthesiology

## 2013-06-29 ENCOUNTER — Encounter (HOSPITAL_COMMUNITY): Payer: Self-pay | Admitting: *Deleted

## 2013-06-29 DIAGNOSIS — Z8249 Family history of ischemic heart disease and other diseases of the circulatory system: Secondary | ICD-10-CM

## 2013-06-29 DIAGNOSIS — Z87891 Personal history of nicotine dependence: Secondary | ICD-10-CM

## 2013-06-29 DIAGNOSIS — C61 Malignant neoplasm of prostate: Principal | ICD-10-CM | POA: Diagnosis present

## 2013-06-29 DIAGNOSIS — Z8042 Family history of malignant neoplasm of prostate: Secondary | ICD-10-CM

## 2013-06-29 HISTORY — PX: ROBOT ASSISTED LAPAROSCOPIC RADICAL PROSTATECTOMY: SHX5141

## 2013-06-29 LAB — ABO/RH: ABO/RH(D): O POS

## 2013-06-29 LAB — HEMOGLOBIN AND HEMATOCRIT, BLOOD
HCT: 41.4 % (ref 39.0–52.0)
HEMOGLOBIN: 14 g/dL (ref 13.0–17.0)

## 2013-06-29 LAB — TYPE AND SCREEN
ABO/RH(D): O POS
ANTIBODY SCREEN: NEGATIVE

## 2013-06-29 SURGERY — ROBOTIC ASSISTED LAPAROSCOPIC RADICAL PROSTATECTOMY LEVEL 1
Anesthesia: General

## 2013-06-29 MED ORDER — NEOSTIGMINE METHYLSULFATE 1 MG/ML IJ SOLN
INTRAMUSCULAR | Status: DC | PRN
  Administered 2013-06-29: 4 mg via INTRAVENOUS

## 2013-06-29 MED ORDER — BUPIVACAINE-EPINEPHRINE (PF) 0.25% -1:200000 IJ SOLN
INTRAMUSCULAR | Status: AC
  Filled 2013-06-29: qty 30

## 2013-06-29 MED ORDER — FLEET ENEMA 7-19 GM/118ML RE ENEM: 1.0000 | ENEMA | Freq: Once | RECTAL | Status: DC

## 2013-06-29 MED ORDER — GLYCOPYRROLATE 0.2 MG/ML IJ SOLN
INTRAMUSCULAR | Status: DC | PRN
  Administered 2013-06-29: .6 mg via INTRAVENOUS

## 2013-06-29 MED ORDER — DIPHENHYDRAMINE HCL 12.5 MG/5ML PO ELIX: 12.5000 mg | ORAL_SOLUTION | Freq: Four times a day (QID) | ORAL | Status: DC | PRN

## 2013-06-29 MED ORDER — LORATADINE 10 MG PO TABS
10.0000 mg | ORAL_TABLET | Freq: Every day | ORAL | Status: DC
Start: 2013-06-30 — End: 2013-06-30
  Administered 2013-06-30: 10 mg via ORAL
  Filled 2013-06-29: qty 1

## 2013-06-29 MED ORDER — KCL IN DEXTROSE-NACL 20-5-0.45 MEQ/L-%-% IV SOLN
INTRAVENOUS | Status: DC
Start: 2013-06-29 — End: 2013-06-30
  Administered 2013-06-29 – 2013-06-30 (×3): via INTRAVENOUS
  Filled 2013-06-29 (×5): qty 1000

## 2013-06-29 MED ORDER — DIPHENHYDRAMINE HCL 50 MG/ML IJ SOLN: 12.5000 mg | Freq: Four times a day (QID) | INTRAMUSCULAR | Status: DC | PRN

## 2013-06-29 MED ORDER — MAGNESIUM CITRATE PO SOLN: 1.0000 | Freq: Once | ORAL | Status: DC

## 2013-06-29 MED ORDER — LACTATED RINGERS IV SOLN
INTRAVENOUS | Status: DC | PRN
  Administered 2013-06-29: 08:00:00

## 2013-06-29 MED ORDER — HYDROMORPHONE HCL PF 1 MG/ML IJ SOLN
INTRAMUSCULAR | Status: AC
  Filled 2013-06-29: qty 1

## 2013-06-29 MED ORDER — NEOSTIGMINE METHYLSULFATE 1 MG/ML IJ SOLN
INTRAMUSCULAR | Status: AC
  Filled 2013-06-29: qty 10

## 2013-06-29 MED ORDER — HYDROMORPHONE HCL PF 1 MG/ML IJ SOLN
0.2500 mg | INTRAMUSCULAR | Status: DC | PRN
  Administered 2013-06-29 (×2): 0.5 mg via INTRAVENOUS
  Administered 2013-06-29: 0.25 mg via INTRAVENOUS

## 2013-06-29 MED ORDER — PROPOFOL 10 MG/ML IV BOLUS
INTRAVENOUS | Status: AC
  Filled 2013-06-29: qty 20

## 2013-06-29 MED ORDER — MIDAZOLAM HCL 2 MG/2ML IJ SOLN
INTRAMUSCULAR | Status: AC
  Filled 2013-06-29: qty 2

## 2013-06-29 MED ORDER — BUPIVACAINE-EPINEPHRINE 0.25% -1:200000 IJ SOLN
INTRAMUSCULAR | Status: DC | PRN
  Administered 2013-06-29: 30 mL

## 2013-06-29 MED ORDER — CEFAZOLIN SODIUM-DEXTROSE 2-3 GM-% IV SOLR
2.0000 g | INTRAVENOUS | Status: AC
  Administered 2013-06-29: 2 g via INTRAVENOUS

## 2013-06-29 MED ORDER — LACTATED RINGERS IV SOLN
INTRAVENOUS | Status: DC | PRN
  Administered 2013-06-29 (×2): via INTRAVENOUS

## 2013-06-29 MED ORDER — IPRATROPIUM BROMIDE 0.03 % NA SOLN
1.0000 | Freq: Two times a day (BID) | NASAL | Status: DC
  Administered 2013-06-29 – 2013-06-30 (×2): 1 via NASAL
  Filled 2013-06-29: qty 30

## 2013-06-29 MED ORDER — CIPROFLOXACIN HCL 500 MG PO TABS: 500.0000 mg | ORAL_TABLET | Freq: Two times a day (BID) | ORAL | Status: DC

## 2013-06-29 MED ORDER — DOCUSATE SODIUM 100 MG PO CAPS
100.0000 mg | ORAL_CAPSULE | Freq: Two times a day (BID) | ORAL | Status: DC
  Administered 2013-06-29 – 2013-06-30 (×2): 100 mg via ORAL
  Filled 2013-06-29 (×3): qty 1

## 2013-06-29 MED ORDER — FENTANYL CITRATE 0.05 MG/ML IJ SOLN
INTRAMUSCULAR | Status: AC
  Filled 2013-06-29: qty 2

## 2013-06-29 MED ORDER — HYDROCODONE-ACETAMINOPHEN 5-325 MG PO TABS: 1.0000 | ORAL_TABLET | Freq: Four times a day (QID) | ORAL | Status: DC | PRN

## 2013-06-29 MED ORDER — ROCURONIUM BROMIDE 100 MG/10ML IV SOLN
INTRAVENOUS | Status: AC
  Filled 2013-06-29: qty 1

## 2013-06-29 MED ORDER — DEXAMETHASONE SODIUM PHOSPHATE 10 MG/ML IJ SOLN
INTRAMUSCULAR | Status: DC | PRN
  Administered 2013-06-29: 10 mg via INTRAVENOUS

## 2013-06-29 MED ORDER — SUCCINYLCHOLINE CHLORIDE 20 MG/ML IJ SOLN
INTRAMUSCULAR | Status: DC | PRN
  Administered 2013-06-29: 100 mg via INTRAVENOUS

## 2013-06-29 MED ORDER — DEXAMETHASONE SODIUM PHOSPHATE 10 MG/ML IJ SOLN
INTRAMUSCULAR | Status: AC
  Filled 2013-06-29: qty 1

## 2013-06-29 MED ORDER — MIDAZOLAM HCL 5 MG/5ML IJ SOLN
INTRAMUSCULAR | Status: DC | PRN
  Administered 2013-06-29: 2 mg via INTRAVENOUS

## 2013-06-29 MED ORDER — HEPARIN SODIUM (PORCINE) 1000 UNIT/ML IJ SOLN
INTRAMUSCULAR | Status: AC
  Filled 2013-06-29: qty 1

## 2013-06-29 MED ORDER — CEFAZOLIN SODIUM 1-5 GM-% IV SOLN
1.0000 g | Freq: Three times a day (TID) | INTRAVENOUS | Status: AC
  Administered 2013-06-29 (×2): 1 g via INTRAVENOUS
  Filled 2013-06-29 (×2): qty 50

## 2013-06-29 MED ORDER — GLYCOPYRROLATE 0.2 MG/ML IJ SOLN
INTRAMUSCULAR | Status: AC
  Filled 2013-06-29: qty 3

## 2013-06-29 MED ORDER — ACETAMINOPHEN 325 MG PO TABS: 650.0000 mg | ORAL_TABLET | ORAL | Status: DC | PRN

## 2013-06-29 MED ORDER — PROMETHAZINE HCL 25 MG/ML IJ SOLN: 6.2500 mg | INTRAMUSCULAR | Status: DC | PRN

## 2013-06-29 MED ORDER — FENTANYL CITRATE 0.05 MG/ML IJ SOLN
INTRAMUSCULAR | Status: AC
  Filled 2013-06-29: qty 5

## 2013-06-29 MED ORDER — CEFAZOLIN SODIUM-DEXTROSE 2-3 GM-% IV SOLR
INTRAVENOUS | Status: AC
  Filled 2013-06-29: qty 50

## 2013-06-29 MED ORDER — SODIUM CHLORIDE 0.9 % IR SOLN
Status: DC | PRN
  Administered 2013-06-29: 1000 mL via INTRAVESICAL

## 2013-06-29 MED ORDER — KETOROLAC TROMETHAMINE 15 MG/ML IJ SOLN
15.0000 mg | Freq: Four times a day (QID) | INTRAMUSCULAR | Status: DC
  Administered 2013-06-29 – 2013-06-30 (×4): 15 mg via INTRAVENOUS
  Filled 2013-06-29 (×6): qty 1

## 2013-06-29 MED ORDER — MORPHINE SULFATE 2 MG/ML IJ SOLN
2.0000 mg | INTRAMUSCULAR | Status: DC | PRN
  Administered 2013-06-29 (×3): 2 mg via INTRAVENOUS
  Filled 2013-06-29 (×3): qty 1

## 2013-06-29 MED ORDER — FENTANYL CITRATE 0.05 MG/ML IJ SOLN
INTRAMUSCULAR | Status: DC | PRN
  Administered 2013-06-29 (×2): 50 ug via INTRAVENOUS
  Administered 2013-06-29: 100 ug via INTRAVENOUS
  Administered 2013-06-29 (×3): 50 ug via INTRAVENOUS

## 2013-06-29 MED ORDER — SODIUM CHLORIDE 0.9 % IV BOLUS (SEPSIS)
1000.0000 mL | Freq: Once | INTRAVENOUS | Status: AC
  Administered 2013-06-29: 1000 mL via INTRAVENOUS

## 2013-06-29 MED ORDER — PROPOFOL 10 MG/ML IV BOLUS
INTRAVENOUS | Status: DC | PRN
  Administered 2013-06-29: 180 mg via INTRAVENOUS

## 2013-06-29 MED ORDER — ONDANSETRON HCL 4 MG/2ML IJ SOLN
INTRAMUSCULAR | Status: DC | PRN
  Administered 2013-06-29: 4 mg via INTRAVENOUS

## 2013-06-29 MED ORDER — ONDANSETRON HCL 4 MG/2ML IJ SOLN
INTRAMUSCULAR | Status: AC
  Filled 2013-06-29: qty 2

## 2013-06-29 MED ORDER — ROCURONIUM BROMIDE 100 MG/10ML IV SOLN
INTRAVENOUS | Status: DC | PRN
  Administered 2013-06-29: 30 mg via INTRAVENOUS
  Administered 2013-06-29 (×3): 10 mg via INTRAVENOUS

## 2013-06-29 SURGICAL SUPPLY — 44 items
CABLE HIGH FREQUENCY MONO STRZ (ELECTRODE) ×3 IMPLANT
CANISTER SUCTION 2500CC (MISCELLANEOUS) IMPLANT
CATH FOLEY 2WAY SLVR 18FR 30CC (CATHETERS) ×3 IMPLANT
CATH ROBINSON RED A/P 16FR (CATHETERS) ×3 IMPLANT
CATH ROBINSON RED A/P 8FR (CATHETERS) ×3 IMPLANT
CATH TIEMANN FOLEY 18FR 5CC (CATHETERS) ×3 IMPLANT
CHLORAPREP W/TINT 26ML (MISCELLANEOUS) ×3 IMPLANT
CLIP LIGATING HEM O LOK PURPLE (MISCELLANEOUS) IMPLANT
CLOTH BEACON ORANGE TIMEOUT ST (SAFETY) ×3 IMPLANT
COVER SURGICAL LIGHT HANDLE (MISCELLANEOUS) ×3 IMPLANT
COVER TIP SHEARS 8 DVNC (MISCELLANEOUS) ×1 IMPLANT
COVER TIP SHEARS 8MM DA VINCI (MISCELLANEOUS) ×2
CUTTER ECHEON FLEX ENDO 45 340 (ENDOMECHANICALS) ×3 IMPLANT
DECANTER SPIKE VIAL GLASS SM (MISCELLANEOUS) IMPLANT
DERMABOND ADVANCED (GAUZE/BANDAGES/DRESSINGS) ×2
DERMABOND ADVANCED .7 DNX12 (GAUZE/BANDAGES/DRESSINGS) ×1 IMPLANT
DRAPE SURG IRRIG POUCH 19X23 (DRAPES) ×3 IMPLANT
DRSG TEGADERM 4X4.75 (GAUZE/BANDAGES/DRESSINGS) ×3 IMPLANT
DRSG TEGADERM 6X8 (GAUZE/BANDAGES/DRESSINGS) ×3 IMPLANT
ELECT REM PT RETURN 9FT ADLT (ELECTROSURGICAL) ×3
ELECTRODE REM PT RTRN 9FT ADLT (ELECTROSURGICAL) ×1 IMPLANT
GLOVE BIO SURGEON STRL SZ 6.5 (GLOVE) ×2 IMPLANT
GLOVE BIO SURGEONS STRL SZ 6.5 (GLOVE) ×1
GLOVE BIOGEL M STRL SZ7.5 (GLOVE) ×21 IMPLANT
GOWN STRL REUS W/TWL LRG LVL3 (GOWN DISPOSABLE) ×12 IMPLANT
HEMOSTAT SURGICEL 4X8 (HEMOSTASIS) ×3 IMPLANT
HOLDER FOLEY CATH W/STRAP (MISCELLANEOUS) ×3 IMPLANT
IV LACTATED RINGERS 1000ML (IV SOLUTION) ×3 IMPLANT
KIT ACCESSORY DA VINCI DISP (KITS) ×2
KIT ACCESSORY DVNC DISP (KITS) ×1 IMPLANT
NDL SAFETY ECLIPSE 18X1.5 (NEEDLE) ×1 IMPLANT
NEEDLE HYPO 18GX1.5 SHARP (NEEDLE) ×2
PACK ROBOT UROLOGY CUSTOM (CUSTOM PROCEDURE TRAY) ×3 IMPLANT
RELOAD GREEN ECHELON 45 (STAPLE) ×3 IMPLANT
SCISSORS LAP 5X45 EPIX DISP (ENDOMECHANICALS) ×3 IMPLANT
SET TUBE IRRIG SUCTION NO TIP (IRRIGATION / IRRIGATOR) ×3 IMPLANT
SOLUTION ELECTROLUBE (MISCELLANEOUS) ×3 IMPLANT
SUT ETHILON 3 0 PS 1 (SUTURE) ×3 IMPLANT
SUT MNCRL AB 4-0 PS2 18 (SUTURE) ×6 IMPLANT
SUT VICRYL 0 UR6 27IN ABS (SUTURE) ×6 IMPLANT
SYR 27GX1/2 1ML LL SAFETY (SYRINGE) ×3 IMPLANT
TOWEL OR 17X26 10 PK STRL BLUE (TOWEL DISPOSABLE) ×3 IMPLANT
TOWEL OR NON WOVEN STRL DISP B (DISPOSABLE) ×3 IMPLANT
WATER STERILE IRR 1500ML POUR (IV SOLUTION) ×6 IMPLANT

## 2013-06-29 NOTE — Progress Notes (Signed)
Pt is using home CPAP machine, mask and tubing. The CPAP cord is not frayed and seems to be in good working condition. [facility] was contacted and will inspect Timothy Harrell's machine tomorrow. Pt does not require any assistance with mask application. Pt states his machine is in Auto titrate mode (min-7cmH2O & max-20cmH2O). Sterile water was added for humidification. Pt was made aware if he should need any assistance throughout the night to contact RT. RT will continue to monitor as needed.

## 2013-06-29 NOTE — Op Note (Signed)
Preoperative diagnosis: Clinically localized adenocarcinoma of the prostate (clinical stage T1c Nx Mx)  Postoperative diagnosis: Clinically localized adenocarcinoma of the prostate (clinical stage T1c Nx Mx)  Procedure:  1. Robotic assisted laparoscopic radical prostatectomy (bilateral nerve sparing)  Surgeon: Roxy Horseman, Brooke Bonito. M.D.  Assistant: Leta Baptist, PA-C  Anesthesia: General  Complications: None  EBL: 50 mL  IVF:  1100 mL crystalloid  Specimens: 1. Prostate and seminal vesicles  Disposition of specimens: Pathology  Drains: 1. 20 Fr coude catheter 2. # 19 Blake pelvic drain  Indication: Timothy Harrell is a 58 y.o. year old patient with clinically localized prostate cancer.  After a thorough review of the management options for treatment of prostate cancer, he elected to proceed with surgical therapy and the above procedure(s).  We have discussed the potential benefits and risks of the procedure, side effects of the proposed treatment, the likelihood of the patient achieving the goals of the procedure, and any potential problems that might occur during the procedure or recuperation. Informed consent has been obtained.  Description of procedure:  The patient was taken to the operating room and a general anesthetic was administered. He was given preoperative antibiotics, placed in the dorsal lithotomy position, and prepped and draped in the usual sterile fashion. Next a preoperative timeout was performed. A urethral catheter was placed into the bladder and a site was selected near the umbilicus for placement of the camera port. This was placed using a standard open Hassan technique which allowed entry into the peritoneal cavity under direct vision and without difficulty. A 12 mm port was placed and a pneumoperitoneum established. The camera was then used to inspect the abdomen and there was no evidence of any intra-abdominal injuries or other abnormalities. The  remaining abdominal ports were then placed. 8 mm robotic ports were placed in the right lower quadrant, left lower quadrant, and far left lateral abdominal wall. A 5 mm port was placed in the right upper quadrant and a 12 mm port was placed in the right lateral abdominal wall for laparoscopic assistance. All ports were placed under direct vision without difficulty. The surgical cart was then docked.   Utilizing the cautery scissors, the bladder was reflected posteriorly allowing entry into the space of Retzius and identification of the endopelvic fascia and prostate. The periprostatic fat was then removed from the prostate allowing full exposure of the endopelvic fascia. The endopelvic fascia was then incised from the apex back to the base of the prostate bilaterally and the underlying levator muscle fibers were swept laterally off the prostate thereby isolating the dorsal venous complex. The dorsal vein was then stapled and divided with a 45 mm Flex Echelon stapler. Attention then turned to the bladder neck which was divided anteriorly thereby allowing entry into the bladder and exposure of the urethral catheter. The catheter balloon was deflated and the catheter was brought into the operative field and used to retract the prostate anteriorly. The posterior bladder neck was then examined and was divided allowing further dissection between the bladder and prostate posteriorly until the vasa deferentia and seminal vessels were identified. The vasa deferentia were isolated, divided, and lifted anteriorly. The seminal vesicles were dissected down to their tips with care to control the seminal vascular arterial blood supply. These structures were then lifted anteriorly and the space between Denonvillier's fascia and the anterior rectum was developed with a combination of sharp and blunt dissection. This isolated the vascular pedicles of the prostate.  The lateral prostatic fascia  was then sharply incised allowing  release of the neurovascular bundles bilaterally. The vascular pedicles of the prostate were then ligated with Weck clips between the prostate and neurovascular bundles and divided with sharp cold scissor dissection resulting in neurovascular bundle preservation. The neurovascular bundles were then separated off the apex of the prostate and urethra bilaterally.  The urethra was then sharply transected allowing the prostate specimen to be disarticulated. The pelvis was copiously irrigated and hemostasis was ensured. There was no evidence for rectal injury.  Attention then turned to the urethral anastomosis. A 2-0 Vicryl slip knot was placed between Denonvillier's fascia, the posterior bladder neck, and the posterior urethra to reapproximate these structures. A double-armed 3-0 Monocryl suture was then used to perform a 360 running tension-free anastomosis between the bladder neck and urethra. A new urethral catheter was then placed into the bladder and irrigated. There were no blood clots within the bladder and the anastomosis appeared to be watertight. A #19 Blake drain was then brought through the left lateral 8 mm port site and positioned appropriately within the pelvis. It was secured to the skin with a nylon suture. The surgical cart was then undocked. The right lateral 12 mm port site was closed at the fascial level with a 0 Vicryl suture placed laparoscopically. All remaining ports were then removed under direct vision. The prostate specimen was removed intact within the Endopouch retrieval bag via the periumbilical camera port site. This fascial opening was closed with two running 0 Vicryl sutures. 0.25% Marcaine was then injected into all port sites and all incisions were reapproximated at the skin level with 4-0 Monocryl subcuticular sutures and Dermabond. The patient appeared to tolerate the procedure well and without complications. The patient was able to be extubated and transferred to the recovery  unit in satisfactory condition.  Pryor Curia MD

## 2013-06-29 NOTE — Discharge Instructions (Signed)
1. Activity:  You are encouraged to ambulate frequently (about every hour during waking hours) to help prevent blood clots from forming in your legs or lungs.  However, you should not engage in any heavy lifting (> 10-15 lbs), strenuous activity, or straining. 2. Diet: You should continue a clear liquid diet until passing gas from below.  Once this occurs, you may advance your diet to a soft diet that would be easy to digest (i.e soups, scrambled eggs, mashed potatoes, etc.) for 24 hours just as you would if getting over a bad stomach flu.  If tolerating this diet well for 24 hours, you may then begin eating regular food.  It will be normal to have some amount of bloating, nausea, and abdominal discomfort intermittently. 3. Prescriptions:  You will be provided a prescription for pain medication to take as needed.  If your pain is not severe enough to require the prescription pain medication, you may take extra strength Tylenol instead.  You should also take an over the counter stool softener (Colace 100 mg twice daily) to avoid straining with bowel movements as the pain medication may constipate you. Finally, you will also be provided a prescription for an antibiotic to begin the day prior to your return visit in the office for catheter removal. 4. Catheter care: You will be taught how to take care of the catheter by the nursing staff prior to discharge from the hospital.  You may use both a leg bag and the larger bedside bag but it is recommended to at least use the bigger bedside bag at nighttime as the leg bag is small and will fill up overnight and also does not drain as well when lying flat. You may periodically feel a strong urge to void with the catheter in place.  This is a bladder spasm and most often can occur when having a bowel movement or when you are moving around. It is typically self-limited and usually will stop after a few minutes.  You may use some Vaseline or Neosporin around the tip of the  catheter to reduce friction at the tip of the penis. 5. Incisions: You may remove your dressing bandages the 2nd day after surgery.  You most likely will have a few small staples in each of the incisions and once the bandages are removed, the incisions may stay open to air.  You may start showering (not soaking or bathing in water) 48 hours after surgery and the incisions simply need to be patted dry after the shower.  No additional care is needed. 6. What to call us about: You should call the office [phone]) if you develop fever > 101, persistent vomiting, or the catheter stops draining. Also, feel free to call with any other questions you may have and remember the handout that was provided to you as a reference preoperatively which answers many of the common questions that arise after surgery.  You may resume aspirin, vitamins, advil, aleve, and supplements 7 days after surgery.

## 2013-06-29 NOTE — Anesthesia Postprocedure Evaluation (Signed)
  Anesthesia Post-op Note  Patient: Paramedic  Procedure(s) Performed: Procedure(s) (LRB): ROBOTIC ASSISTED LAPAROSCOPIC RADICAL PROSTATECTOMY LEVEL 1 (N/A)  Patient Location: PACU  Anesthesia Type: General  Level of Consciousness: awake and alert   Airway and Oxygen Therapy: Patient Spontanous Breathing  Post-op Pain: mild  Post-op Assessment: Post-op Vital signs reviewed, Patient's Cardiovascular Status Stable, Respiratory Function Stable, Patent Airway and No signs of Nausea or vomiting  Last Vitals:  Filed Vitals:   06/29/13 1116  BP: 115/70  Pulse: 57  Temp: 36.4 C  Resp: 13    Post-op Vital Signs: stable   Complications: No apparent anesthesia complications

## 2013-06-29 NOTE — Anesthesia Preprocedure Evaluation (Signed)
Anesthesia Evaluation  Patient identified by MRN, date of birth, ID band Patient awake    Reviewed: Allergy & Precautions, H&P , NPO status , Patient's Chart, lab work & pertinent test results  Airway Mallampati: II TM Distance: >3 FB Neck ROM: Full    Dental no notable dental hx.    Pulmonary sleep apnea , former smoker,  breath sounds clear to auscultation  Pulmonary exam normal       Cardiovascular Exercise Tolerance: Good negative cardio ROS  Rhythm:Regular Rate:Normal     Neuro/Psych negative neurological ROS  negative psych ROS   GI/Hepatic negative GI ROS, Neg liver ROS,   Endo/Other  negative endocrine ROS  Renal/GU negative Renal ROS  negative genitourinary   Musculoskeletal negative musculoskeletal ROS (+)   Abdominal (+) + obese,   Peds negative pediatric ROS (+)  Hematology negative hematology ROS (+)   Anesthesia Other Findings   Reproductive/Obstetrics negative OB ROS                           Anesthesia Physical Anesthesia Plan  ASA: II  Anesthesia Plan: General   Post-op Pain Management:    Induction: Intravenous  Airway Management Planned: Oral ETT  Additional Equipment:   Intra-op Plan:   Post-operative Plan: Extubation in OR  Informed Consent: I have reviewed the patients History and Physical, chart, labs and discussed the procedure including the risks, benefits and alternatives for the proposed anesthesia with the patient or authorized representative who has indicated his/her understanding and acceptance.   Dental advisory given  Plan Discussed with: CRNA  Anesthesia Plan Comments:         Anesthesia Quick Evaluation

## 2013-06-29 NOTE — Transfer of Care (Signed)
Immediate Anesthesia Transfer of Care Note  Patient: Paramedic  Procedure(s) Performed: Procedure(s): ROBOTIC ASSISTED LAPAROSCOPIC RADICAL PROSTATECTOMY LEVEL 1 (N/A)  Patient Location: PACU  Anesthesia Type:General  Level of Consciousness: awake, alert , oriented and patient cooperative  Airway & Oxygen Therapy: Patient Spontanous Breathing and Patient connected to face mask oxygen  Post-op Assessment: Report given to PACU RN and Post -op Vital signs reviewed and stable  Post vital signs: Reviewed and stable  Complications: No apparent anesthesia complications

## 2013-06-29 NOTE — Progress Notes (Signed)
Patient ID: Timothy Harrell, male   DOB: 1955-03-27, 59 y.o.   MRN: [id number] Post-op note  Subjective: The patient is doing well.  No complaints.  Has amb.  Denies N/V  Objective: Vital signs in last 24 hours: Temp:  [97.5 F (36.4 C)-97.8 F (36.6 C)] 97.6 F (36.4 C) (04/20 1116) Pulse Rate:  [54-64] 57 (04/20 1116) Resp:  [10-18] 13 (04/20 1116) BP: (110-145)/(61-84) 115/70 mmHg (04/20 1116) SpO2:  [92 %-99 %] 96 % (04/20 1116) Weight:  [104.327 kg (230 lb)] 104.327 kg (230 lb) (04/20 1123)  Intake/Output from previous day:   Intake/Output this shift: Total I/O In: 1200 [I.V.:1200] Out: 170 [Urine:125; Drains:20; Blood:25]  Physical Exam:  General: Alert and oriented. Abdomen: Soft, Nondistended. Incisions: Clean and dry. Urine: red  Lab Results:  Recent Labs  06/29/13 0946  HGB 14.0  HCT 41.4    Assessment/Plan: POD#0   1) Continue to monitor  2) DVT prophy, clears, amb, IS, pain control     LOS: 0 days   Dewayne Hatch. Izora Ribas 06/29/2013, 2:22 PM

## 2013-06-29 NOTE — Care Management Note (Signed)
CARE MANAGEMENT NOTE 06/29/2013  Patient:  Timothy Harrell, Timothy Harrell   Account Number:  [id number]  Date Initiated:  06/29/2013  Documentation initiated by:  Ravin Denardo  Subjective/Objective Assessment:   58 yo male admitted with adenocarcinoma of prostate.     Action/Plan:   Home when stable   Anticipated DC Date:     Anticipated DC Plan:  [place]  CM consult      Choice offered to / List presented to:  NA   DME arranged  NA      DME agency  NA     [place] arranged  NA      [place] agency  NA   Status of service:  Completed, signed off Medicare Important Message given?   (If response is "NO", the following Medicare IM given date fields will be blank) Date Medicare IM given:   Date Additional Medicare IM given:    Discharge Disposition:    Per UR Regulation:  Reviewed for med. necessity/level of care/duration of stay  If discussed at [place] of Stay Meetings, dates discussed:    Comments:  06/29/13 [place] Chart reviewed for utilization of services. No needs identified at this time.

## 2013-06-30 ENCOUNTER — Encounter (HOSPITAL_COMMUNITY): Payer: Self-pay | Admitting: Urology

## 2013-06-30 LAB — HEMOGLOBIN AND HEMATOCRIT, BLOOD
HCT: 37.4 % — ABNORMAL LOW (ref 39.0–52.0)
Hemoglobin: 12.8 g/dL — ABNORMAL LOW (ref 13.0–17.0)

## 2013-06-30 MED ORDER — BISACODYL 10 MG RE SUPP
10.0000 mg | Freq: Once | RECTAL | Status: AC
  Administered 2013-06-30: 10 mg via RECTAL
  Filled 2013-06-30: qty 1

## 2013-06-30 MED ORDER — HYDROCODONE-ACETAMINOPHEN 5-325 MG PO TABS: 1.0000 | ORAL_TABLET | Freq: Four times a day (QID) | ORAL | Status: DC | PRN

## 2013-06-30 NOTE — Progress Notes (Signed)
Patient ID: Timothy Harrell, male   DOB: 14-Dec-1955, 58 y.o.   MRN: [id number]  1 Day Post-Op Subjective: The patient is doing well.  No nausea or vomiting. Pain is adequately controlled.  Objective: Vital signs in last 24 hours: Temp:  [97.5 F (36.4 C)-98.7 F (37.1 C)] 98.7 F (37.1 C) (04/21 0629) Pulse Rate:  [54-66] 63 (04/21 0629) Resp:  [10-20] 20 (04/21 0629) BP: (107-145)/(59-78) 107/59 mmHg (04/21 0629) SpO2:  [92 %-99 %] 96 % (04/21 0629) Weight:  [104.327 kg (230 lb)] 104.327 kg (230 lb) (04/20 1123)  Intake/Output from previous day: 04/20 0701 - 04/21 0700 In: 4520 [P.O.:600; I.V.:3920] Out: 3140 [WUJWJ:1914; Drains:90; Blood:25] Intake/Output this shift:    Physical Exam:  General: Alert and oriented. CV: RRR Lungs: Clear bilaterally. GI: Soft, Nondistended. Incisions: Clean, dry, and intact Urine: Clear Extremities: Nontender, no erythema, no edema.  Lab Results:  Recent Labs  06/29/13 0946 06/30/13 0336  HGB 14.0 12.8*  HCT 41.4 37.4*      Assessment/Plan: POD# 1 s/p robotic prostatectomy.  1) SL IVF 2) Ambulate, Incentive spirometry 3) Transition to oral pain medication 4) Dulcolax suppository 5) D/C pelvic drain 6) Plan for likely discharge later today   Pryor Curia. MD   LOS: 1 day   Dutch Gray 06/30/2013, 7:03 AM

## 2013-06-30 NOTE — Discharge Summary (Signed)
  Date of admission: 06/29/2013  Date of discharge: 06/30/2013  Admission diagnosis: Prostate Cancer  Discharge diagnosis: Prostate Cancer  History and Physical: For full details, please see admission history and physical. Briefly, Timothy Harrell is a 58 y.o. gentleman with localized prostate cancer.  After discussing management/treatment options, he elected to proceed with surgical treatment.  Hospital Course: Timothy Harrell was taken to the operating room on 06/29/2013 and underwent a robotic assisted laparoscopic radical prostatectomy. He tolerated this procedure well and without complications. Postoperatively, he was able to be transferred to a regular hospital room following recovery from anesthesia.  He was able to begin ambulating the night of surgery. He remained hemodynamically stable overnight.  He had excellent urine output with appropriately minimal output from his pelvic drain and his pelvic drain was removed on POD #1.  He was transitioned to oral pain medication, tolerated a clear liquid diet, and had met all discharge criteria and was able to be discharged home later on POD#1.  Laboratory values:  Recent Labs  06/29/13 0946 06/30/13 0336  HGB 14.0 12.8*  HCT 41.4 37.4*    Disposition: Home  Discharge instruction: He was instructed to be ambulatory but to refrain from heavy lifting, strenuous activity, or driving. He was instructed on urethral catheter care.  Discharge medications:     Medication List    STOP taking these medications       multivitamin with minerals tablet     tadalafil 20 MG tablet  Commonly known as:  CIALIS      TAKE these medications       cetirizine 10 MG tablet  Commonly known as:  ZYRTEC  Take 10 mg by mouth every morning.     ciprofloxacin 500 MG tablet  Commonly known as:  CIPRO  Take 1 tablet (500 mg total) by mouth 2 (two) times daily. Start day prior to office visit for foley removal     HYDROcodone-acetaminophen 5-325 MG per  tablet  Commonly known as:  NORCO  Take 1-2 tablets by mouth every 6 (six) hours as needed.     ipratropium 0.03 % nasal spray  Commonly known as:  ATROVENT  Place 1 spray into both nostrils every 12 (twelve) hours.     zolpidem 10 MG tablet  Commonly known as:  AMBIEN  Take 3.33 mg by mouth at bedtime as needed for sleep. Takes 1/3 tablet        Followup: He will followup in 1 week for catheter removal and to discuss his surgical pathology results.

## 2013-07-11 ENCOUNTER — Emergency Department (HOSPITAL_COMMUNITY)
Admission: EM | Admit: 2013-07-11 | Discharge: 2013-07-11 | Disposition: A | Discharge status: Home / Self Care | Payer: 59 | Attending: Emergency Medicine | Admitting: Emergency Medicine

## 2013-07-11 ENCOUNTER — Encounter (HOSPITAL_COMMUNITY): Payer: Self-pay | Admitting: Emergency Medicine

## 2013-07-11 DIAGNOSIS — Z8546 Personal history of malignant neoplasm of prostate: Secondary | ICD-10-CM | POA: Insufficient documentation

## 2013-07-11 DIAGNOSIS — Z79899 Other long term (current) drug therapy: Secondary | ICD-10-CM | POA: Insufficient documentation

## 2013-07-11 DIAGNOSIS — Z860100 Personal history of colon polyps, unspecified: Secondary | ICD-10-CM | POA: Insufficient documentation

## 2013-07-11 DIAGNOSIS — R339 Retention of urine, unspecified: Secondary | ICD-10-CM | POA: Insufficient documentation

## 2013-07-11 DIAGNOSIS — Z87891 Personal history of nicotine dependence: Secondary | ICD-10-CM | POA: Insufficient documentation

## 2013-07-11 DIAGNOSIS — Z87448 Personal history of other diseases of urinary system: Secondary | ICD-10-CM | POA: Insufficient documentation

## 2013-07-11 LAB — URINALYSIS, ROUTINE W REFLEX MICROSCOPIC
Bilirubin Urine: NEGATIVE
Glucose, UA: NEGATIVE mg/dL
KETONES UR: NEGATIVE mg/dL
Nitrite: NEGATIVE
PROTEIN: NEGATIVE mg/dL
Specific Gravity, Urine: 1.005 (ref 1.005–1.030)
Urobilinogen, UA: 0.2 mg/dL (ref 0.0–1.0)
pH: 6 (ref 5.0–8.0)

## 2013-07-11 LAB — URINE MICROSCOPIC-ADD ON

## 2013-07-11 MED ORDER — TAMSULOSIN HCL 0.4 MG PO CAPS: 0.4000 mg | ORAL_CAPSULE | Freq: Every day | ORAL | Status: DC

## 2013-07-11 MED ORDER — TAMSULOSIN HCL 0.4 MG PO CAPS
0.4000 mg | ORAL_CAPSULE | Freq: Every day | ORAL | Status: DC
  Administered 2013-07-11: 0.4 mg via ORAL
  Filled 2013-07-11: qty 1

## 2013-07-11 NOTE — ED Notes (Signed)
Pt c/o urinary retention onset last evening. Pt recently had prostate removal, foley removal Tuesday, no issues until last evening, dribbles @2300 , now having significant pressure and unable to pass urine.

## 2013-07-11 NOTE — ED Provider Notes (Signed)
CSN: [id number]     Arrival date & time 07/11/13  0146 History   First MD Initiated Contact with Patient 07/11/13 0245     Chief Complaint  Patient presents with  . Urinary Retention     (Consider location/radiation/quality/duration/timing/severity/associated sxs/prior Treatment) HPI Male presents to emergency room from home with complaint of urinary retention.  Patient had prostatectomy done on April 20 by Dr. Alinda Money.  He had his Foley removed in the clinic on Tuesday.  Patient has not had problems until this evening when he was unable to pass urine.  This started this evening, around 11 PM.  Patient reports severe suprapubic pressure and pain.  No fevers chills nausea vomiting.  Patient has had Foley placed by nursing staff prior to my evaluation, and reports all symptoms have since resolved.   Past Medical History  Diagnosis Date  . Allergy   . History of colonic polyps     hyperplastic  . 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 ED (erectile dysfunction) of organic origin   . Insomnia   . Sleep apnea     uses pt does not know settings   Past Surgical History  Procedure Laterality Date  . Vasectomy    . Prostate biopsy  04/22/2013    Gleason 6  . Robot assisted laparoscopic radical prostatectomy N/A 06/29/2013    Procedure: ROBOTIC ASSISTED LAPAROSCOPIC RADICAL PROSTATECTOMY LEVEL 1;  Surgeon: Dutch Gray, MD;  Location: [facility];  Service: Urology;  Laterality: N/A;   Family History  Problem Relation Age of Onset  . Cancer Father     prostate  . Heart attack Father    History  Substance Use Topics  . Smoking status: Former Smoker -- 0.50 packs/day for 15 years    Types: Cigarettes, Cigars    Quit date: 03/13/1999  . Smokeless tobacco: Never Used     Comment: Smokes cigars currently once every 2 week2  . Alcohol Use: Yes     Comment: social    Review of Systems   See History of Present Illness; otherwise all other systems are reviewed and negative  Allergies   Review of patient's allergies indicates no known allergies.  Home Medications   Prior to Admission medications   Medication Sig Start Date End Date Taking? Authorizing Provider  docusate sodium (COLACE) 100 MG capsule Take 100 mg by mouth daily.   Yes Historical Provider, MD  HYDROcodone-acetaminophen (NORCO/VICODIN) 5-325 MG per tablet Take 1-2 tablets by mouth every 6 (six) hours as needed for moderate pain.   Yes Historical Provider, MD  Melatonin-Pyridoxine (MELATIN PO) Take 1 tablet by mouth daily.   Yes Historical Provider, MD  ciprofloxacin (CIPRO) 500 MG tablet Take 500 mg by mouth 2 (two) times daily. 3 day therapy course patient completed on 07/08/2013. Start day prior to office visit for foley removal 06/29/13   Dewayne Hatch. Izora Ribas, PA-C  tamsulosin (FLOMAX) 0.4 MG CAPS capsule Take 1 capsule (0.4 mg total) by mouth daily. 07/11/13   Kalman Drape, MD   BP 113/67  Pulse 67  Temp(Src) 97.8 F (36.6 C) (Oral)  Resp 18  SpO2 96% Physical Exam  Nursing note and vitals reviewed. Constitutional: He is oriented to person, place, and time. He appears well-developed and well-nourished.  HENT:  Head: Normocephalic and atraumatic.  Nose: Nose normal.  Mouth/Throat: Oropharynx is clear and moist.  Eyes: Conjunctivae and EOM are normal. Pupils are equal, round, and reactive to light.  Neck: Normal range  of motion. Neck supple. No JVD present. No tracheal deviation present. No thyromegaly present.  Cardiovascular: Normal rate, regular rhythm, normal heart sounds and intact distal pulses.  Exam reveals no gallop and no friction rub.   No murmur heard. Pulmonary/Chest: Effort normal and breath sounds normal. No stridor. No respiratory distress. He has no wheezes. He has no rales. He exhibits no tenderness.  Abdominal: Soft. Bowel sounds are normal. He exhibits no distension and no mass. There is no tenderness. There is no rebound and no guarding.  Genitourinary:  Foley in place draining  yellow urine  Musculoskeletal: Normal range of motion. He exhibits no edema and no tenderness.  Lymphadenopathy:    He has no cervical adenopathy.  Neurological: He is alert and oriented to person, place, and time. He exhibits normal muscle tone. Coordination normal.  Skin: Skin is warm and dry. No rash noted. No erythema. No pallor.  Psychiatric: He has a normal mood and affect. His behavior is normal. Judgment and thought content normal.    ED Course  Procedures (including critical care time) Labs Review Labs Reviewed  URINALYSIS, ROUTINE W REFLEX MICROSCOPIC - Abnormal; Notable for the following:    Hgb urine dipstick LARGE (*)    Leukocytes, UA TRACE (*)    All other components within normal limits  URINE MICROSCOPIC-ADD ON    Imaging Review No results found.   EKG Interpretation None      MDM   Final diagnoses:  Urinary retention    58 year old gentleman with urinary retention after recent Foley removal after prostatectomy.  Patient to followup with his urologist this week.    Kalman Drape, MD 07/11/13 [phone]

## 2013-07-11 NOTE — ED Notes (Signed)
Bladder scan: 852mL

## 2013-07-11 NOTE — Discharge Instructions (Signed)
Acute Urinary Retention, Male °Acute urinary retention is the temporary inability to urinate. °This is a common problem in older men. As men age their prostates become larger and block the flow of urine from the bladder. This is usually a problem that has come on gradually.  °HOME CARE INSTRUCTIONS °If you are sent home with a Foley catheter and a drainage system, you will need to discuss the best course of action with your health care provider. While the catheter is in, maintain a good intake of fluids. Keep the drainage bag emptied and lower than your catheter. This is so that contaminated urine will not flow back into your bladder, which could lead to a urinary tract infection. °There are two main types of drainage bags. One is a large bag that usually is used at night. It has a good capacity that will allow you to sleep through the night without having to empty it. The second type is called a leg bag. It has a smaller capacity, so it needs to be emptied more frequently. However, the main advantage is that it can be attached by a leg strap and can go underneath your clothing, allowing you the freedom to move about or leave your home. °Only take over-the-counter or prescription medicines for pain, discomfort, or fever as directed by your health care provider.  °SEEK MEDICAL CARE IF: °· You develop a low-grade fever. °· You experience spasms or leakage of urine with the spasms. °SEEK IMMEDIATE MEDICAL CARE IF:  °· You develop chills or fever. °· Your catheter stops draining urine. °· Your catheter falls out. °· You start to develop increased bleeding that does not respond to rest and increased fluid intake. °MAKE SURE YOU: °· Understand these instructions. °· Will watch your condition. °· Will get help right away if you are not doing well or get worse. °Document Released: 06/04/2000 Document Revised: 10/29/2012 Document Reviewed: 08/07/2012 °[employer]® Patient Information ©2014 [employer]. ° °

## 2013-07-19 ENCOUNTER — Emergency Department (HOSPITAL_COMMUNITY)
Admission: EM | Admit: 2013-07-19 | Discharge: 2013-07-19 | Disposition: A | Discharge status: Home / Self Care | Payer: 59 | Attending: Emergency Medicine | Admitting: Emergency Medicine

## 2013-07-19 ENCOUNTER — Encounter (HOSPITAL_COMMUNITY): Payer: Self-pay | Admitting: Emergency Medicine

## 2013-07-19 DIAGNOSIS — R339 Retention of urine, unspecified: Secondary | ICD-10-CM | POA: Insufficient documentation

## 2013-07-19 DIAGNOSIS — N529 Male erectile dysfunction, unspecified: Secondary | ICD-10-CM | POA: Insufficient documentation

## 2013-07-19 DIAGNOSIS — Z8546 Personal history of malignant neoplasm of prostate: Secondary | ICD-10-CM | POA: Insufficient documentation

## 2013-07-19 DIAGNOSIS — Z87891 Personal history of nicotine dependence: Secondary | ICD-10-CM | POA: Insufficient documentation

## 2013-07-19 DIAGNOSIS — G4700 Insomnia, unspecified: Secondary | ICD-10-CM | POA: Insufficient documentation

## 2013-07-19 DIAGNOSIS — Z860100 Personal history of colon polyps, unspecified: Secondary | ICD-10-CM | POA: Insufficient documentation

## 2013-07-19 DIAGNOSIS — Z906 Acquired absence of other parts of urinary tract: Secondary | ICD-10-CM | POA: Insufficient documentation

## 2013-07-19 DIAGNOSIS — E669 Obesity, unspecified: Secondary | ICD-10-CM | POA: Insufficient documentation

## 2013-07-19 DIAGNOSIS — Z79899 Other long term (current) drug therapy: Secondary | ICD-10-CM | POA: Insufficient documentation

## 2013-07-19 DIAGNOSIS — R109 Unspecified abdominal pain: Secondary | ICD-10-CM | POA: Insufficient documentation

## 2013-07-19 DIAGNOSIS — R300 Dysuria: Secondary | ICD-10-CM | POA: Insufficient documentation

## 2013-07-19 LAB — URINALYSIS, ROUTINE W REFLEX MICROSCOPIC
Bilirubin Urine: NEGATIVE
GLUCOSE, UA: NEGATIVE mg/dL
KETONES UR: NEGATIVE mg/dL
NITRITE: NEGATIVE
PH: 6 (ref 5.0–8.0)
PROTEIN: NEGATIVE mg/dL
Specific Gravity, Urine: 1.011 (ref 1.005–1.030)
Urobilinogen, UA: 0.2 mg/dL (ref 0.0–1.0)

## 2013-07-19 LAB — URINE MICROSCOPIC-ADD ON

## 2013-07-19 NOTE — Discharge Instructions (Signed)
Take MiraLAX as discussed and continue to stay hydrated. See a physician if you develop worsening pain, extensive blood clots or fevers. Followup with urology in the next 2-3 days.

## 2013-07-19 NOTE — ED Notes (Signed)
Per pt he was unable to void despite drinking 2L of water a day.  Pt had prostatectomy in April.  Foley placed in triage and pt is now pain free.

## 2013-07-19 NOTE — ED Provider Notes (Signed)
CSN: [id number]     Arrival date & time 07/19/13  0020 History   First MD Initiated Contact with Patient 07/19/13 0050     Chief Complaint  Patient presents with  . Urinary Retention     (Consider location/radiation/quality/duration/timing/severity/associated sxs/prior Treatment) HPI Comments: 58 year old male with history of prostate cancer low-grade, obesity, urinary retention presents with urinary retention. Since 8 PM this evening patient was unable to finish voiding. Patient similar episode a week prior requiring Foley placement and improved after followup urology. This is the same as previous. No fevers chills or systemic symptoms. Patient feels well otherwise. Mild suprapubic fullness. Patient had recent prostatectomy.  The history is provided by the patient.    Past Medical History  Diagnosis Date  . Allergy   . History of colonic polyps     hyperplastic  . 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 ED (erectile dysfunction) of organic origin   . Insomnia   . Sleep apnea     uses pt does not know settings   Past Surgical History  Procedure Laterality Date  . Vasectomy    . Prostate biopsy  04/22/2013    Gleason 6  . Robot assisted laparoscopic radical prostatectomy N/A 06/29/2013    Procedure: ROBOTIC ASSISTED LAPAROSCOPIC RADICAL PROSTATECTOMY LEVEL 1;  Surgeon: Dutch Gray, MD;  Location: [facility];  Service: Urology;  Laterality: N/A;   Family History  Problem Relation Age of Onset  . Cancer Father     prostate  . Heart attack Father    History  Substance Use Topics  . Smoking status: Former Smoker -- 0.50 packs/day for 15 years    Types: Cigarettes, Cigars    Quit date: 03/13/1999  . Smokeless tobacco: Never Used     Comment: Smokes cigars currently once every 2 week2  . Alcohol Use: Yes     Comment: social    Review of Systems  Constitutional: Negative for fever and chills.  HENT: Negative for congestion.   Gastrointestinal: Positive for abdominal pain.  Negative for vomiting.  Genitourinary: Positive for difficulty urinating. Negative for dysuria and flank pain.  Musculoskeletal: Negative for back pain, neck pain and neck stiffness.  Skin: Negative for rash.  Neurological: Negative for light-headedness and headaches.      Allergies  Review of patient's allergies indicates no known allergies.  Home Medications   Prior to Admission medications   Medication Sig Start Date End Date Taking? Authorizing Provider  docusate sodium (COLACE) 100 MG capsule Take 300 mg by mouth See admin instructions. 2 caps in the morning and 1 in the afternoon.   Yes Historical Provider, MD  HYDROcodone-acetaminophen (NORCO/VICODIN) 5-325 MG per tablet Take 1-2 tablets by mouth every 6 (six) hours as needed for moderate pain.   Yes Historical Provider, MD  Melatonin-Pyridoxine (MELATIN PO) Take 1 tablet by mouth at bedtime.    Yes Historical Provider, MD  tadalafil (CIALIS) 5 MG tablet Take 5 mg by mouth every morning.   Yes Historical Provider, MD  tamsulosin (FLOMAX) 0.4 MG CAPS capsule Take 1 capsule (0.4 mg total) by mouth daily. 07/11/13  Yes Kalman Drape, MD   BP 120/75  Pulse 62  Temp(Src) 98 F (36.7 C) (Oral)  Resp 16  SpO2 96% Physical Exam  Nursing note and vitals reviewed. Constitutional: He is oriented to person, place, and time. He appears well-developed and well-nourished.  HENT:  Head: Normocephalic and atraumatic.  Eyes: Right eye exhibits no discharge. Left eye  exhibits no discharge.  Neck: Normal range of motion.  Cardiovascular: Normal rate.   Pulmonary/Chest: Effort normal.  Abdominal: Soft. He exhibits no distension. There is tenderness (mild suprapubic). There is no guarding.  Musculoskeletal: He exhibits no edema.  Neurological: He is alert and oriented to person, place, and time.  Skin: Skin is warm. No rash noted.  Psychiatric: He has a normal mood and affect.    ED Course  Procedures (including critical care  time) Limited Ultrasound of bladder  Performed by Dr. Reather Converse Indication: to assess for urinary retention and/or bladder volume prior to urinary catheter Technique:  Low frequency probe utilized in two planes to assess bladder volume in real-time. Findings: Bladder volume minimal after foley Additional findings:foley visualized Images were archived electronically  Labs Review Labs Reviewed  URINALYSIS, ROUTINE W REFLEX MICROSCOPIC    Imaging Review No results found.   EKG Interpretation None      MDM   Final diagnoses:  Urinary retention    Clinically acute urinary retention likely from postop/swelling/spasm versus constipation. Patient has had constipation issues since surgery but has withheld taking MiraLAX as he was trying to improve with time, fluids eating well etc. Patient symptoms resolved with Foley placement in the ER. At that ultrasound postplacement showed no further urinary retention. Patient denies any symptoms currently and understands followup with urology and recommended take MiraLAX to help prevent another episode.  Results and differential diagnosis were discussed with the patient. Close follow up outpatient was discussed, patient comfortable with the plan.   Filed Vitals:   07/19/13 0030 07/19/13 0054  BP: 121/64 120/75  Pulse: 74 62  Temp: 98.4 F (36.9 C) 98 F (36.7 C)  TempSrc: Oral Oral  Resp: 15 16  SpO2: 97% 96%         Mariea Clonts, MD 07/19/13 [phone]

## 2013-07-23 ENCOUNTER — Emergency Department (HOSPITAL_COMMUNITY)
Admission: EM | Admit: 2013-07-23 | Discharge: 2013-07-24 | Disposition: A | Discharge status: Home / Self Care | Payer: 59 | Attending: Emergency Medicine | Admitting: Emergency Medicine

## 2013-07-23 ENCOUNTER — Encounter (HOSPITAL_COMMUNITY): Payer: Self-pay | Admitting: Emergency Medicine

## 2013-07-23 DIAGNOSIS — Z8546 Personal history of malignant neoplasm of prostate: Secondary | ICD-10-CM | POA: Insufficient documentation

## 2013-07-23 DIAGNOSIS — Z860100 Personal history of colon polyps, unspecified: Secondary | ICD-10-CM | POA: Insufficient documentation

## 2013-07-23 DIAGNOSIS — G4730 Sleep apnea, unspecified: Secondary | ICD-10-CM | POA: Insufficient documentation

## 2013-07-23 DIAGNOSIS — Z87448 Personal history of other diseases of urinary system: Secondary | ICD-10-CM | POA: Insufficient documentation

## 2013-07-23 DIAGNOSIS — R339 Retention of urine, unspecified: Secondary | ICD-10-CM | POA: Insufficient documentation

## 2013-07-23 DIAGNOSIS — R109 Unspecified abdominal pain: Secondary | ICD-10-CM | POA: Insufficient documentation

## 2013-07-23 DIAGNOSIS — Z79899 Other long term (current) drug therapy: Secondary | ICD-10-CM | POA: Insufficient documentation

## 2013-07-23 DIAGNOSIS — Z87891 Personal history of nicotine dependence: Secondary | ICD-10-CM | POA: Insufficient documentation

## 2013-07-23 DIAGNOSIS — Z906 Acquired absence of other parts of urinary tract: Secondary | ICD-10-CM | POA: Insufficient documentation

## 2013-07-23 NOTE — Progress Notes (Signed)
Bladder scanned. 10 CC's

## 2013-07-23 NOTE — ED Provider Notes (Signed)
CSN: [id number]     Arrival date & time 07/23/13  2241 History   First MD Initiated Contact with Patient 07/23/13 2316     Chief Complaint  Patient presents with  . Urinary Retention     (Consider location/radiation/quality/duration/timing/severity/associated sxs/prior Treatment) HPI  Viraaj Alkire is a 58 y.o. male complaining of complaining of urinary retention, has not urinated fully since 8:30 PM tonight, feels a 7/10 suprapubic pressure. Patient is status post prostatectomy for prostate cancer on April 20. He has visited the emergency room 3 times in the past 2 weeks for urinary retention. Patient had Foley DC'd today, followed with his urologist and had a flow test which was normal.    Past Medical History  Diagnosis Date  . Allergy   . History of colonic polyps     hyperplastic  . 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 ED (erectile dysfunction) of organic origin   . Insomnia   . Sleep apnea     uses pt does not know settings   Past Surgical History  Procedure Laterality Date  . Vasectomy    . Prostate biopsy  04/22/2013    Gleason 6  . Robot assisted laparoscopic radical prostatectomy N/A 06/29/2013    Procedure: ROBOTIC ASSISTED LAPAROSCOPIC RADICAL PROSTATECTOMY LEVEL 1;  Surgeon: Dutch Gray, MD;  Location: [facility];  Service: Urology;  Laterality: N/A;   Family History  Problem Relation Age of Onset  . Cancer Father     prostate  . Heart attack Father    History  Substance Use Topics  . Smoking status: Former Smoker -- 0.50 packs/day for 15 years    Types: Cigarettes, Cigars    Quit date: 03/13/1999  . Smokeless tobacco: Never Used     Comment: Smokes cigars currently once every 2 week2  . Alcohol Use: Yes     Comment: social    Review of Systems  10 systems reviewed and found to be negative, except as noted in the HPI.   Allergies  Review of patient's allergies indicates no known allergies.  Home Medications   Prior to Admission medications    Medication Sig Start Date End Date Taking? Authorizing Provider  docusate sodium (COLACE) 100 MG capsule Take 100-200 mg by mouth 2 (two) times daily. 2 caps in the morning and 1 in the afternoon.   Yes Historical Provider, MD  polyethylene glycol (MIRALAX / GLYCOLAX) packet Take 17 g by mouth daily as needed for mild constipation.   Yes Historical Provider, MD  tadalafil (CIALIS) 5 MG tablet Take 5 mg by mouth every morning.    Historical Provider, MD   BP 119/77  Pulse 68  Temp(Src) 98.7 F (37.1 C) (Oral)  Resp 20  SpO2 100% Physical Exam  Nursing note and vitals reviewed. Constitutional: He is oriented to person, place, and time. He appears well-developed and well-nourished. No distress.  HENT:  Head: Normocephalic and atraumatic.  Mouth/Throat: Oropharynx is clear and moist.  Eyes: Conjunctivae and EOM are normal. Pupils are equal, round, and reactive to light.  Neck: Normal range of motion.  Cardiovascular: Normal rate, regular rhythm and intact distal pulses.   Pulmonary/Chest: Effort normal and breath sounds normal. No stridor. No respiratory distress. He has no wheezes. He has no rales. He exhibits no tenderness.  Abdominal: Soft. Bowel sounds are normal. He exhibits no distension and no mass. There is tenderness. There is no rebound and no guarding.  + suprapubic TTP  Musculoskeletal: Normal range  of motion. He exhibits no edema.  Neurological: He is alert and oriented to person, place, and time.  Psychiatric: He has a normal mood and affect.    ED Course  Procedures (including critical care time) Labs Review Labs Reviewed  URINALYSIS, ROUTINE W REFLEX MICROSCOPIC - Abnormal; Notable for the following:    APPearance CLOUDY (*)    Hgb urine dipstick MODERATE (*)    Leukocytes, UA SMALL (*)    All other components within normal limits  I-STAT CHEM 8, ED - Abnormal; Notable for the following:    Glucose, Bld 101 (*)    Hemoglobin 12.9 (*)    HCT 38.0 (*)    All other  components within normal limits  URINE MICROSCOPIC-ADD ON    Imaging Review No results found.   EKG Interpretation None      MDM   Final diagnoses:  None    Filed Vitals:   07/23/13 2252  BP: 119/77  Pulse: 68  Temp: 98.7 F (37.1 C)  TempSrc: Oral  Resp: 20  SpO2: 100%    Medications - No data to display  Natnael Kessner is a 58 y.o. male presenting with urinary retention. The patient has prostatectomy approximately 2 weeks ago. He's had 3 visits for urinary retention in that time frame. The patient was able to urinate in the ED. States it is an incomplete void however he states that his pain is slightly improved. Minimal post void residual seen on ultrasound. No need for catheterization. Patient will follow with urology tomorrow.  Evaluation does not show pathology that would require ongoing emergent intervention or inpatient treatment. Pt is hemodynamically stable and mentating appropriately. Discussed findings and plan with patient/guardian, who agrees with care plan. All questions answered. Return precautions discussed and outpatient follow up given.   Note: Portions of this report may have been transcribed using voice recognition software. Every effort was made to ensure accuracy; however, inadvertent computerized transcription errors may be present     Monico Blitz, PA-C 07/26/13 [phone]

## 2013-07-23 NOTE — ED Notes (Signed)
Pt reports urinary retention, reports having a foley catheter discontinued earlier today, has been able to urinate until 2030 tonight.  Pt also reports feeling discomfort and states that his bladder is filling up.

## 2013-07-24 LAB — I-STAT CHEM 8, ED
BUN: 14 mg/dL (ref 6–23)
CALCIUM ION: 1.23 mmol/L (ref 1.12–1.23)
CREATININE: 1.1 mg/dL (ref 0.50–1.35)
Chloride: 103 mEq/L (ref 96–112)
Glucose, Bld: 101 mg/dL — ABNORMAL HIGH (ref 70–99)
HCT: 38 % — ABNORMAL LOW (ref 39.0–52.0)
HEMOGLOBIN: 12.9 g/dL — AB (ref 13.0–17.0)
Potassium: 3.9 mEq/L (ref 3.7–5.3)
Sodium: 141 mEq/L (ref 137–147)
TCO2: 28 mmol/L (ref 0–100)

## 2013-07-24 LAB — URINE MICROSCOPIC-ADD ON

## 2013-07-24 LAB — URINALYSIS, ROUTINE W REFLEX MICROSCOPIC
Bilirubin Urine: NEGATIVE
GLUCOSE, UA: NEGATIVE mg/dL
KETONES UR: NEGATIVE mg/dL
Nitrite: NEGATIVE
PROTEIN: NEGATIVE mg/dL
Specific Gravity, Urine: 1.018 (ref 1.005–1.030)
UROBILINOGEN UA: 0.2 mg/dL (ref 0.0–1.0)
pH: 7.5 (ref 5.0–8.0)

## 2013-07-24 NOTE — Discharge Instructions (Signed)
Please follow with your primary care doctor in the next 2 days for a check-up. They must obtain records for further management.   Do not hesitate to return to the Emergency Department for any new, worsening or concerning symptoms.    Acute Urinary Retention Acute urinary retention is when you are unable to pee (urinate). Acute urinary retention is common in older men. Prostates can get bigger, which blocks the flow of pee.  HOME CARE  Drink enough fluids to keep your pee clear or pale yellow.  If you are sent home with a tube that drains the bladder (catheter), there will be a drainage bag attached to it. There are two types of bags. One is big that you can wear at night without having to empty it. One is smaller and needs to be emptied more often.  Keep the drainage bag empty.  Keep the drainage bag lower than your catheter.  Only take medicine as told by your doctor. GET HELP IF:  You have a low-grade fever.  You have spasms or you are leaking pee when you have spasms. GET HELP RIGHT AWAY IF:   You have chills or a fever.  Your catheter stops draining pee.  Your catheter falls out.  You have increased bleeding that does not stop after you have rested and increased the amount of fluids you had been drinking. MAKE SURE YOU:   Understand these instructions.  Will watch your condition.  Will get help right away if you are not doing well or get worse. Document Released: 08/15/2007 Document Revised: 12/17/2012 Document Reviewed: 08/07/2012 [facility] Patient Information 2014 [place], [place].

## 2013-07-26 NOTE — ED Provider Notes (Signed)
Medical screening examination/treatment/procedure(s) were performed by non-physician practitioner and as supervising physician I was immediately available for consultation/collaboration.   EKG Interpretation None       Teressa Lower, MD 07/26/13 2300

## 2013-11-04 ENCOUNTER — Ambulatory Visit (INDEPENDENT_AMBULATORY_CARE_PROVIDER_SITE_OTHER): Payer: 59 | Admitting: Family Medicine

## 2013-11-04 ENCOUNTER — Encounter: Payer: Self-pay | Admitting: Family Medicine

## 2013-11-04 VITALS — BP 116/76 | HR 71 | Wt 213.0 lb

## 2013-11-04 DIAGNOSIS — M25512 Pain in left shoulder: Secondary | ICD-10-CM

## 2013-11-04 DIAGNOSIS — M25519 Pain in unspecified shoulder: Secondary | ICD-10-CM

## 2013-11-04 NOTE — Progress Notes (Signed)
Pre visit review using our clinic review tool, if applicable. No additional management support is needed unless otherwise documented below in the visit note. 

## 2013-11-04 NOTE — Progress Notes (Signed)
   Subjective:    Patient ID: Timothy Harrell, male    DOB: 25-Oct-1955, 58 y.o.   MRN: [id number]  Shoulder Pain  Pertinent negatives include no numbness.    Patient seen with left shoulder pain. Onset was several months ago after he picked up a heavy item. He was aware of some pain afterwards. His pain is mostly left anterior shoulder. Exacerbated by external rotation and somewhat with abduction. No definite weakness. No restricted range of motion. No cervical neck pain. Minimal night pain. He has not taken any consistent medications.  In April, he underwent robotic-assisted laparoscopic radical prostatectomy for prostate cancer. No complications other than he had some persistent erectile dysfunction.  Past Medical History  Diagnosis Date  . Allergy   . History of colonic polyps     hyperplastic  . 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 ED (erectile dysfunction) of organic origin   . Insomnia   . Sleep apnea     uses pt does not know settings   Past Surgical History  Procedure Laterality Date  . Vasectomy    . Prostate biopsy  04/22/2013    Gleason 6  . Robot assisted laparoscopic radical prostatectomy N/A 06/29/2013    Procedure: ROBOTIC ASSISTED LAPAROSCOPIC RADICAL PROSTATECTOMY LEVEL 1;  Surgeon: Dutch Gray, MD;  Location: [facility];  Service: Urology;  Laterality: N/A;    reports that he quit smoking about 14 years ago. His smoking use included Cigarettes and Cigars. He has a 7.5 pack-year smoking history. He has never used smokeless tobacco. He reports that he drinks alcohol. He reports that he does not use illicit drugs. family history includes Cancer in his father; Heart attack in his father. No Known Allergies     Review of Systems  Neurological: Negative for weakness and numbness.       Objective:   Physical Exam  Constitutional: He appears well-developed and well-nourished.  Cardiovascular: Normal rate and regular rhythm.   Pulmonary/Chest: Effort normal and  breath sounds normal. No respiratory distress. He has no wheezes. He has no rales.  Musculoskeletal:  Shoulders are symmetric in appearance. No atrophy. No a.c. joint tenderness. No biceps tenderness. He has pain with abduction against resistance and also external rotation. No definite weakness          Assessment & Plan:  Left shoulder pain of several months duration. Question rotator cuff tendinitis. Doubt tear. Set up sports medicine referral

## 2013-11-25 ENCOUNTER — Ambulatory Visit (INDEPENDENT_AMBULATORY_CARE_PROVIDER_SITE_OTHER): Payer: 59 | Admitting: Family Medicine

## 2013-11-25 ENCOUNTER — Other Ambulatory Visit (INDEPENDENT_AMBULATORY_CARE_PROVIDER_SITE_OTHER): Payer: 59

## 2013-11-25 ENCOUNTER — Encounter: Payer: Self-pay | Admitting: Family Medicine

## 2013-11-25 VITALS — BP 132/84 | HR 83 | Ht 74.0 in | Wt 213.0 lb

## 2013-11-25 DIAGNOSIS — M25519 Pain in unspecified shoulder: Secondary | ICD-10-CM

## 2013-11-25 DIAGNOSIS — M7552 Bursitis of left shoulder: Secondary | ICD-10-CM

## 2013-11-25 DIAGNOSIS — M7550 Bursitis of unspecified shoulder: Secondary | ICD-10-CM | POA: Insufficient documentation

## 2013-11-25 DIAGNOSIS — M75100 Unspecified rotator cuff tear or rupture of unspecified shoulder, not specified as traumatic: Secondary | ICD-10-CM

## 2013-11-25 DIAGNOSIS — M25512 Pain in left shoulder: Secondary | ICD-10-CM

## 2013-11-25 DIAGNOSIS — IMO0002 Reserved for concepts with insufficient information to code with codable children: Secondary | ICD-10-CM

## 2013-11-25 MED ORDER — MELOXICAM 15 MG PO TABS: 15.0000 mg | ORAL_TABLET | Freq: Every day | ORAL | Status: DC

## 2013-11-25 NOTE — Patient Instructions (Signed)
Very nice to meet you Ice 20 minutes 2 times daily. Usually after activity and before bed. Exercises 3 times a week. Alternate with bicep and shoulder.  Meloxicam daily for 10 days then as needed.  Consider Vitamin D 2000 IU Dialy to help with healing and muscle energy and endurance. Come back in 3 weeks

## 2013-11-25 NOTE — Assessment & Plan Note (Signed)
She was injection today. We discussed an icing regimen as well as a home exercise program. Patient will do 10 days of anti-inflammatory. Patient and will come back and see me again in 3 weeks for further evaluation and treatment. Continuing to have pain I would consider doing an injection within the bicep tendon sheath as well.

## 2013-11-25 NOTE — Progress Notes (Signed)
[facility] [address], [place] [address] Phone: [phone] Subjective:    I'm seeing this patient by the request  of:  Eulas Post, MD   CC: Left shoulder pain  YIR:[id number] Timothy Harrell is a 58 y.o. male coming in with complaint of left shoulder pain. 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 Patient felt a twinge on the anterior aspect of the arm. Patient states that the pain was moderate and seems to not be getting better over the course of the last 6 months. Patient did not state any further care secondary to prostate cancer. Patient states he notices a dull nagging pain in hurts with certain activity or sitting for long amount of time. Denies any weakness or any radiation of pain down the arm. Patient rates the severity is 6/10 but can wake him up at night. Patient has not tried any over-the-counter medications. Patient has no history of injury in this arm previously.     Past medical history, social, surgical and family history all reviewed in electronic medical record.   Review of Systems: No headache, visual changes, nausea, vomiting, diarrhea, constipation, dizziness, abdominal pain, skin rash, fevers, chills, night sweats, weight loss, swollen lymph nodes, body aches, joint swelling, muscle aches, chest pain, shortness of breath, mood changes.   Objective There were no vitals taken for this visit.  General: No apparent distress alert and oriented x3 mood and affect normal, dressed appropriately.  HEENT: Pupils equal, extraocular movements intact  Respiratory: Patient's speak in full sentences and does not appear short of breath  Cardiovascular: No lower extremity edema, non tender, no erythema  Skin: Warm dry intact with no signs of infection or rash on extremities or on axial skeleton.  Abdomen: Soft nontender  Neuro: Cranial nerves II through XII are  intact, neurovascularly intact in all extremities with 2+ DTRs and 2+ pulses.  Lymph: No lymphadenopathy of posterior or anterior cervical chain or axillae bilaterally.  Gait normal with good balance and coordination.  MSK:  Non tender with full range of motion and good stability and symmetric strength and tone of  elbows, wrist, hip, knee and ankles bilaterally.  Shoulder: left Inspection reveals no abnormalities, atrophy or asymmetry. Palpation is normal with no tenderness over AC joint or bicipital groove. ROM is full in all planes passively. Rotator cuff strength normal throughout. signs of impingement with positive Neer and Hawkin's tests, but negative empty can sign. Speeds and Yergason's tests normal. No labral pathology noted with negative Obrien's, negative clunk and good stability. Normal scapular function observed. No painful arc and no drop arm sign. No apprehension sign  MSK US performed of: left This study was ordered, performed, and interpreted by Charlann Boxer D.O.  Shoulder:   Supraspinatus:  Appears normal on long and transverse views, Bursal bulge seen with shoulder abduction on impingement view. Infraspinatus:  Appears normal on long and transverse views. Significant increase in Doppler flow Subscapularis:  Appears normal on long and transverse views. Positive bursa Teres Minor:  Appears normal on long and transverse views. AC joint:  Capsule undistended, no geyser sign. Glenohumeral Joint:  Appears normal without effusion. Glenoid Labrum:  Intact without visualized tears. Biceps Tendon:  Hypoechoic changes noted of the bicep tendon but no true tear appreciated.  Impression: Subacromial bursitis biceps tendinitis  Procedure: Real-time Ultrasound Guided Injection of left glenohumeral joint Device: GE Logiq E  Ultrasound guided injection is preferred based studies that show increased duration, increased effect, greater accuracy, decreased procedural pain, increased  response rate with ultrasound guided versus blind injection.  Verbal informed consent obtained.  Time-out conducted.  Noted no overlying erythema, induration, or other signs of local infection.  Skin prepped in a sterile fashion.  Local anesthesia: Topical Ethyl chloride.  With sterile technique and under real time ultrasound guidance:  Joint visualized.  23g 1  inch needle inserted posterior approach. Pictures taken for needle placement. Patient did have injection of 2 cc of 1% lidocaine, 2 cc of 0.5% Marcaine, and 1.0 cc of Kenalog 40 mg/dL. Completed without difficulty  Pain immediately resolved suggesting accurate placement of the medication.  Advised to call if fevers/chills, erythema, induration, drainage, or persistent bleeding.  Images permanently stored and available for review in the ultrasound unit.  Impression: Technically successful ultrasound guided injection.     Impression and Recommendations:     This case required medical decision making of moderate complexity.

## 2014-01-17 ENCOUNTER — Other Ambulatory Visit: Payer: Self-pay | Admitting: Family Medicine

## 2014-01-18 NOTE — Telephone Encounter (Signed)
Refill done.  

## 2014-02-10 ENCOUNTER — Other Ambulatory Visit: Payer: Self-pay | Admitting: Family Medicine

## 2014-04-01 ENCOUNTER — Other Ambulatory Visit (INDEPENDENT_AMBULATORY_CARE_PROVIDER_SITE_OTHER): Payer: 59 | Admitting: *Deleted

## 2014-04-01 ENCOUNTER — Other Ambulatory Visit (INDEPENDENT_AMBULATORY_CARE_PROVIDER_SITE_OTHER): Payer: Self-pay

## 2014-04-01 DIAGNOSIS — Z0000 Encounter for general adult medical examination without abnormal findings: Secondary | ICD-10-CM

## 2014-04-01 LAB — POCT URINALYSIS DIPSTICK
BILIRUBIN UA: NEGATIVE
Blood, UA: NEGATIVE
GLUCOSE UA: NEGATIVE
KETONES UA: NEGATIVE
LEUKOCYTES UA: NEGATIVE
Nitrite, UA: NEGATIVE
Protein, UA: NEGATIVE
SPEC GRAV UA: 1.015
Urobilinogen, UA: 0.2
pH, UA: 7.5

## 2014-04-01 LAB — CBC WITH DIFFERENTIAL/PLATELET
Basophils Absolute: 0 10*3/uL (ref 0.0–0.1)
Basophils Relative: 0.3 % (ref 0.0–3.0)
EOS ABS: 0.1 10*3/uL (ref 0.0–0.7)
EOS PCT: 1.3 % (ref 0.0–5.0)
HCT: 44.1 % (ref 39.0–52.0)
Hemoglobin: 15.1 g/dL (ref 13.0–17.0)
LYMPHS PCT: 22.1 % (ref 12.0–46.0)
Lymphs Abs: 1.3 10*3/uL (ref 0.7–4.0)
MCHC: 34.2 g/dL (ref 30.0–36.0)
MCV: 88.8 fl (ref 78.0–100.0)
MONO ABS: 0.4 10*3/uL (ref 0.1–1.0)
Monocytes Relative: 7 % (ref 3.0–12.0)
NEUTROS ABS: 4.1 10*3/uL (ref 1.4–7.7)
Neutrophils Relative %: 69.3 % (ref 43.0–77.0)
PLATELETS: 217 10*3/uL (ref 150.0–400.0)
RBC: 4.97 Mil/uL (ref 4.22–5.81)
RDW: 12.5 % (ref 11.5–15.5)
WBC: 5.9 10*3/uL (ref 4.0–10.5)

## 2014-04-01 LAB — COMPREHENSIVE METABOLIC PANEL
ALT: 32 U/L (ref 0–53)
AST: 32 U/L (ref 0–37)
Albumin: 4.3 g/dL (ref 3.5–5.2)
Alkaline Phosphatase: 57 U/L (ref 39–117)
BILIRUBIN TOTAL: 0.6 mg/dL (ref 0.2–1.2)
BUN: 17 mg/dL (ref 6–23)
CALCIUM: 9.8 mg/dL (ref 8.4–10.5)
CO2: 28 mEq/L (ref 19–32)
CREATININE: 1.15 mg/dL (ref 0.40–1.50)
Chloride: 105 mEq/L (ref 96–112)
GFR: 69.49 mL/min (ref >60.00)
Glucose, Bld: 97 mg/dL (ref 70–99)
Potassium: 4.6 mEq/L (ref 3.5–5.1)
SODIUM: 139 meq/L (ref 135–145)
TOTAL PROTEIN: 6.8 g/dL (ref 6.0–8.3)

## 2014-04-01 LAB — LIPID PANEL
CHOLESTEROL: 160 mg/dL (ref 0–200)
HDL: 37.8 mg/dL — AB (ref >39.00)
LDL Cholesterol: 92 mg/dL (ref 0–99)
NonHDL: 122.2
Total CHOL/HDL Ratio: 4
Triglycerides: 153 mg/dL — ABNORMAL HIGH (ref 0.0–149.0)
VLDL: 30.6 mg/dL (ref 0.0–40.0)

## 2014-04-01 LAB — PSA: PSA: 0 ng/mL — ABNORMAL LOW (ref 0.10–4.00)

## 2014-04-01 LAB — TSH: TSH: 1.37 u[IU]/mL (ref 0.35–4.50)

## 2014-04-08 ENCOUNTER — Encounter: Payer: Self-pay | Admitting: Family Medicine

## 2014-04-08 ENCOUNTER — Ambulatory Visit (INDEPENDENT_AMBULATORY_CARE_PROVIDER_SITE_OTHER): Payer: 59 | Admitting: Family Medicine

## 2014-04-08 VITALS — BP 120/68 | HR 79 | Temp 98.2°F | Ht 73.0 in | Wt 236.0 lb

## 2014-04-08 DIAGNOSIS — Z0000 Encounter for general adult medical examination without abnormal findings: Secondary | ICD-10-CM

## 2014-04-08 NOTE — Progress Notes (Signed)
   Subjective:    Patient ID: Timothy Harrell, male    DOB: 10/31/1955, 59 y.o.   MRN: [id number]  HPI Here for complete physical. Had total prostatectomy secondary to prostate cancer about 9 months ago. Still has some erectile dysfunction but no incontinence issues. Follow closely by urology. He uses CPAP for obstructive sleep apnea. Generally doing well. Taking no regular medications. Nonsmoker. Just recently started exercise program  Past Medical History  Diagnosis Date  . Allergy   . History of colonic polyps     hyperplastic  . 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 ED (erectile dysfunction) of organic origin   . Insomnia   . Sleep apnea     uses pt does not know settings   Past Surgical History  Procedure Laterality Date  . Vasectomy    . Prostate biopsy  04/22/2013    Gleason 6  . Robot assisted laparoscopic radical prostatectomy N/A 06/29/2013    Procedure: ROBOTIC ASSISTED LAPAROSCOPIC RADICAL PROSTATECTOMY LEVEL 1;  Surgeon: Dutch Gray, MD;  Location: [facility];  Service: Urology;  Laterality: N/A;    reports that he quit smoking about 15 years ago. His smoking use included Cigarettes and Cigars. He has a 7.5 pack-year smoking history. He has never used smokeless tobacco. He reports that he drinks alcohol. He reports that he does not use illicit drugs. family history includes Cancer in his father; Heart attack in his father. No Known Allergies    Review of Systems  Constitutional: Negative for fever, activity change, appetite change and fatigue.  HENT: Negative for congestion, ear pain and trouble swallowing.   Eyes: Negative for pain and visual disturbance.  Respiratory: Negative for cough, shortness of breath and wheezing.   Cardiovascular: Negative for chest pain and palpitations.  Gastrointestinal: Negative for nausea, vomiting, abdominal pain, diarrhea, constipation, blood in stool, abdominal distention and rectal pain.  Genitourinary: Negative for dysuria, hematuria  and testicular pain.  Musculoskeletal: Negative for joint swelling and arthralgias.  Skin: Negative for rash.  Neurological: Negative for dizziness, syncope and headaches.  Hematological: Negative for adenopathy.  Psychiatric/Behavioral: Negative for confusion and dysphoric mood.       Objective:   Physical Exam  Constitutional: He is oriented to person, place, and time. He appears well-developed and well-nourished. No distress.  HENT:  Head: Normocephalic and atraumatic.  Right Ear: External ear normal.  Left Ear: External ear normal.  Mouth/Throat: Oropharynx is clear and moist.  Eyes: Conjunctivae and EOM are normal. Pupils are equal, round, and reactive to light.  Neck: Normal range of motion. Neck supple. No thyromegaly present.  Cardiovascular: Normal rate, regular rhythm and normal heart sounds.   No murmur heard. Pulmonary/Chest: No respiratory distress. He has no wheezes. He has no rales.  Abdominal: Soft. Bowel sounds are normal. He exhibits no distension and no mass. There is no tenderness. There is no rebound and no guarding.  Musculoskeletal: He exhibits no edema.  Lymphadenopathy:    He has no cervical adenopathy.  Neurological: He is alert and oriented to person, place, and time. He displays normal reflexes. No cranial nerve deficit.  Skin: No rash noted.  Psychiatric: He has a normal mood and affect.          Assessment & Plan:  Complete physical. Labs reviewed. No major concerns. Colonoscopy up-to-date. Establish more consistent exercise. Follow-up one year and sooner as needed

## 2014-04-08 NOTE — Progress Notes (Signed)
Pre visit review using our clinic review tool, if applicable. No additional management support is needed unless otherwise documented below in the visit note. 

## 2014-07-19 ENCOUNTER — Other Ambulatory Visit (HOSPITAL_COMMUNITY): Payer: Self-pay | Admitting: Physician Assistant

## 2015-02-02 ENCOUNTER — Ambulatory Visit: Payer: 59 | Admitting: Family Medicine

## 2015-02-16 ENCOUNTER — Encounter: Payer: Self-pay | Admitting: Family Medicine

## 2015-02-16 ENCOUNTER — Ambulatory Visit (INDEPENDENT_AMBULATORY_CARE_PROVIDER_SITE_OTHER): Payer: 59 | Admitting: Family Medicine

## 2015-02-16 VITALS — BP 100/82 | HR 99 | Temp 98.5°F | Resp 14 | Ht 73.0 in | Wt 236.5 lb

## 2015-02-16 DIAGNOSIS — H6122 Impacted cerumen, left ear: Secondary | ICD-10-CM

## 2015-02-16 NOTE — Progress Notes (Signed)
   Subjective:    Patient ID: Timothy Harrell, male    DOB: July 03, 1955, 59 y.o.   MRN: [id number]  HPI Several day history of fullness left ear with yawning. He has noticed a crackling sound in the ear canal. Slight pain. No drainage. No dizziness. No recent fever or chills. No nasal congestion.  Past Medical History  Diagnosis Date  . Allergy   . History of colonic polyps     hyperplastic  . Prostate cancer ([place]) 04/22/13    Gleason 6  . ED (erectile dysfunction) of organic origin   . Insomnia   . Sleep apnea     uses pt does not know settings   Past Surgical History  Procedure Laterality Date  . Vasectomy    . Prostate biopsy  04/22/2013    Gleason 6  . Robot assisted laparoscopic radical prostatectomy N/A 06/29/2013    Procedure: ROBOTIC ASSISTED LAPAROSCOPIC RADICAL PROSTATECTOMY LEVEL 1;  Surgeon: Dutch Gray, MD;  Location: [facility];  Service: Urology;  Laterality: N/A;    reports that he quit smoking about 15 years ago. His smoking use included Cigarettes and Cigars. He has a 7.5 pack-year smoking history. He has never used smokeless tobacco. He reports that he drinks alcohol. He reports that he does not use illicit drugs. family history includes Cancer in his father; Heart attack in his father. No Known Allergies    Review of Systems  Constitutional: Negative for fever and chills.  HENT: Negative for ear discharge and hearing loss.   Neurological: Negative for dizziness.       Objective:   Physical Exam  Constitutional: He appears well-developed and well-nourished.  HENT:  Cerumen impaction left canal. Very minimal cerumen right canal.  Neck: Neck supple.  Cardiovascular: Normal rate and regular rhythm.   Pulmonary/Chest: Effort normal and breath sounds normal. No respiratory distress. He has no wheezes. He has no rales.          Assessment & Plan:  Cerumen impaction left canal. Irrigation. Follow-up as needed Cerumen removed without difficulty. Eardrum appears  normal following irrigation.

## 2015-02-16 NOTE — Patient Instructions (Signed)
Cerumen Impaction The structures of the external ear canal secrete a waxy substance known as cerumen. Excess cerumen can build up in the ear canal, causing a condition known as cerumen impaction. Cerumen impaction can cause ear pain and disrupt the function of the ear. The rate of cerumen production differs for each individual. In certain individuals, the configuration of the ear canal may decrease his or her ability to naturally remove cerumen. CAUSES Cerumen impaction is caused by excessive cerumen production or buildup. RISK FACTORS  Frequent use of swabs to clean ears.  Having narrow ear canals.  Having eczema.  Being dehydrated. SIGNS AND SYMPTOMS  Diminished hearing.  Ear drainage.  Ear pain.  Ear itch. TREATMENT Treatment may involve:  Over-the-counter or prescription ear drops to soften the cerumen.  Removal of cerumen by a health care provider. This may be done with:  Irrigation with warm water. This is the most common method of removal.  Ear curettes and other instruments.  Surgery. This may be done in severe cases. HOME CARE INSTRUCTIONS  Take medicines only as directed by your health care provider.  Do not insert objects into the ear with the intent of cleaning the ear. PREVENTION  Do not insert objects into the ear, even with the intent of cleaning the ear. Removing cerumen as a part of normal hygiene is not necessary, and the use of swabs in the ear canal is not recommended.  Drink enough water to keep your urine clear or pale yellow.  Control your eczema if you have it. SEEK MEDICAL CARE IF:  You develop ear pain.  You develop bleeding from the ear.  The cerumen does not clear after you use ear drops as directed.   This information is not intended to replace advice given to you by your health care provider. Make sure you discuss any questions you have with your health care provider.   Document Released: 04/05/2004 Document Revised: 03/19/2014  Document Reviewed: 10/13/2014 Elsevier Interactive Patient Education 2016 Elsevier Inc.  

## 2015-02-16 NOTE — Progress Notes (Signed)
Pre visit review using our clinic review tool, if applicable. No additional management support is needed unless otherwise documented below in the visit note. 

## 2015-04-21 ENCOUNTER — Ambulatory Visit (INDEPENDENT_AMBULATORY_CARE_PROVIDER_SITE_OTHER): Payer: 59 | Admitting: Family Medicine

## 2015-04-21 ENCOUNTER — Encounter: Payer: Self-pay | Admitting: Family Medicine

## 2015-04-21 VITALS — BP 130/90 | HR 88 | Temp 98.8°F | Wt 236.0 lb

## 2015-04-21 DIAGNOSIS — B9789 Other viral agents as the cause of diseases classified elsewhere: Principal | ICD-10-CM

## 2015-04-21 DIAGNOSIS — J069 Acute upper respiratory infection, unspecified: Secondary | ICD-10-CM

## 2015-04-21 NOTE — Patient Instructions (Signed)
Viral Infections °A viral infection can be caused by different types of viruses. Most viral infections are not serious and resolve on their own. However, some infections may cause severe symptoms and may lead to further complications. °SYMPTOMS °Viruses can frequently cause: °· Minor sore throat. °· Aches and pains. °· Headaches. °· Runny nose. °· Different types of rashes. °· Watery eyes. °· Tiredness. °· Cough. °· Loss of appetite. °· Gastrointestinal infections, resulting in nausea, vomiting, and diarrhea. °These symptoms do not respond to antibiotics because the infection is not caused by bacteria. However, you might catch a bacterial infection following the viral infection. This is sometimes called a "superinfection." Symptoms of such a bacterial infection may include: °· Worsening sore throat with pus and difficulty swallowing. °· Swollen neck glands. °· Chills and a high or persistent fever. °· Severe headache. °· Tenderness over the sinuses. °· Persistent overall ill feeling (malaise), muscle aches, and tiredness (fatigue). °· Persistent cough. °· Yellow, green, or brown mucus production with coughing. °HOME CARE INSTRUCTIONS  °· Only take over-the-counter or prescription medicines for pain, discomfort, diarrhea, or fever as directed by your caregiver. °· Drink enough water and fluids to keep your urine clear or pale yellow. Sports drinks can provide valuable electrolytes, sugars, and hydration. °· Get plenty of rest and maintain proper nutrition. Soups and broths with crackers or rice are fine. °SEEK IMMEDIATE MEDICAL CARE IF:  °· You have severe headaches, shortness of breath, chest pain, neck pain, or an unusual rash. °· You have uncontrolled vomiting, diarrhea, or you are unable to keep down fluids. °· You or your child has an oral temperature above 102° F (38.9° C), not controlled by medicine. °· Your baby is older than 3 months with a rectal temperature of 102° F (38.9° C) or higher. °· Your baby is 3  months old or younger with a rectal temperature of 100.4° F (38° C) or higher. °MAKE SURE YOU:  °· Understand these instructions. °· Will watch your condition. °· Will get help right away if you are not doing well or get worse. °  °This information is not intended to replace advice given to you by your health care provider. Make sure you discuss any questions you have with your health care provider. °  °Document Released: 12/06/2004 Document Revised: 05/21/2011 Document Reviewed: 08/04/2014 °Elsevier Interactive Patient Education ©2016 Elsevier Inc. ° °

## 2015-04-21 NOTE — Progress Notes (Signed)
   Subjective:    Patient ID: Timothy Harrell, male    DOB: 09/27/1955, 60 y.o.   MRN: [id number]  HPI  acute visit  Onset 3 days ago of cough, nasal congestion, sore throat, body aches. Sore throat is slightly improved today.  Mostly clear nasal discharge.  Bodyaches of been relatively mild.  Cough is dry. Denies any nausea or vomiting.   Past Medical History  Diagnosis Date  . Allergy   . History of colonic polyps     hyperplastic  . Prostate cancer ([place]) 04/22/13    Gleason 6  . ED (erectile dysfunction) of organic origin   . Insomnia   . Sleep apnea     uses pt does not know settings   Past Surgical History  Procedure Laterality Date  . Vasectomy    . Prostate biopsy  04/22/2013    Gleason 6  . Robot assisted laparoscopic radical prostatectomy N/A 06/29/2013    Procedure: ROBOTIC ASSISTED LAPAROSCOPIC RADICAL PROSTATECTOMY LEVEL 1;  Surgeon: Dutch Gray, MD;  Location: [facility];  Service: Urology;  Laterality: N/A;    reports that he quit smoking about 16 years ago. His smoking use included Cigarettes and Cigars. He has a 7.5 pack-year smoking history. He has never used smokeless tobacco. He reports that he drinks alcohol. He reports that he does not use illicit drugs. family history includes Cancer in his father; Heart attack in his father. No Known Allergies    Review of Systems  Constitutional: Positive for fatigue. Negative for fever and chills.  HENT: Positive for congestion and sore throat.   Respiratory: Positive for cough.        Objective:   Physical Exam  Constitutional: He appears well-developed and well-nourished.  HENT:  Right Ear: External ear normal.  Left Ear: External ear normal.  Mouth/Throat: Oropharynx is clear and moist.  Cardiovascular: Normal rate and regular rhythm.   Pulmonary/Chest: Effort normal and breath sounds normal. No respiratory distress. He has no wheezes. He has no rales.  Lymphadenopathy:    He has no cervical adenopathy.           Assessment & Plan:   Viral URI with cough. Treat symptomatically with over-the-counter medications. Follow-up for any fever or worsening symptoms.

## 2015-05-12 ENCOUNTER — Other Ambulatory Visit (INDEPENDENT_AMBULATORY_CARE_PROVIDER_SITE_OTHER): Payer: 59

## 2015-05-12 DIAGNOSIS — Z0000 Encounter for general adult medical examination without abnormal findings: Secondary | ICD-10-CM

## 2015-05-12 LAB — CBC WITH DIFFERENTIAL/PLATELET
Basophils Absolute: 0 10*3/uL (ref 0.0–0.1)
Basophils Relative: 0.5 % (ref 0.0–3.0)
EOS PCT: 1.7 % (ref 0.0–5.0)
Eosinophils Absolute: 0.1 10*3/uL (ref 0.0–0.7)
HCT: 43.5 % (ref 39.0–52.0)
Hemoglobin: 14.9 g/dL (ref 13.0–17.0)
LYMPHS ABS: 1.1 10*3/uL (ref 0.7–4.0)
Lymphocytes Relative: 24.5 % (ref 12.0–46.0)
MCHC: 34.3 g/dL (ref 30.0–36.0)
MCV: 88.1 fl (ref 78.0–100.0)
MONOS PCT: 9.7 % (ref 3.0–12.0)
Monocytes Absolute: 0.4 10*3/uL (ref 0.1–1.0)
NEUTROS ABS: 2.9 10*3/uL (ref 1.4–7.7)
Neutrophils Relative %: 63.6 % (ref 43.0–77.0)
Platelets: 218 10*3/uL (ref 150.0–400.0)
RBC: 4.94 Mil/uL (ref 4.22–5.81)
RDW: 12.3 % (ref 11.5–15.5)
WBC: 4.5 10*3/uL (ref 4.0–10.5)

## 2015-05-12 LAB — HEPATIC FUNCTION PANEL
ALK PHOS: 56 U/L (ref 39–117)
ALT: 26 U/L (ref 0–53)
AST: 19 U/L (ref 0–37)
Albumin: 4.5 g/dL (ref 3.5–5.2)
BILIRUBIN TOTAL: 0.5 mg/dL (ref 0.2–1.2)
Bilirubin, Direct: 0.1 mg/dL (ref 0.0–0.3)
Total Protein: 6.8 g/dL (ref 6.0–8.3)

## 2015-05-12 LAB — BASIC METABOLIC PANEL
BUN: 21 mg/dL (ref 6–23)
CO2: 28 meq/L (ref 19–32)
Calcium: 9.6 mg/dL (ref 8.4–10.5)
Chloride: 103 mEq/L (ref 96–112)
Creatinine, Ser: 1.12 mg/dL (ref 0.40–1.50)
GFR: 71.37 mL/min (ref >60.00)
GLUCOSE: 89 mg/dL (ref 70–99)
POTASSIUM: 4.5 meq/L (ref 3.5–5.1)
SODIUM: 137 meq/L (ref 135–145)

## 2015-05-12 LAB — LIPID PANEL
CHOL/HDL RATIO: 5
Cholesterol: 193 mg/dL (ref 0–200)
HDL: 42 mg/dL (ref >39.00)
LDL CALC: 129 mg/dL — AB (ref 0–99)
NONHDL: 150.9
Triglycerides: 112 mg/dL (ref 0.0–149.0)
VLDL: 22.4 mg/dL (ref 0.0–40.0)

## 2015-05-12 LAB — PSA: PSA: 0 ng/mL — ABNORMAL LOW (ref 0.10–4.00)

## 2015-05-12 LAB — TSH: TSH: 1.35 u[IU]/mL (ref 0.35–4.50)

## 2015-05-16 ENCOUNTER — Encounter: Payer: Self-pay | Admitting: Family Medicine

## 2015-05-16 ENCOUNTER — Ambulatory Visit (INDEPENDENT_AMBULATORY_CARE_PROVIDER_SITE_OTHER): Payer: 59 | Admitting: Family Medicine

## 2015-05-16 VITALS — BP 110/82 | HR 79 | Temp 98.6°F | Ht 73.0 in | Wt 232.0 lb

## 2015-05-16 DIAGNOSIS — Z0000 Encounter for general adult medical examination without abnormal findings: Secondary | ICD-10-CM | POA: Diagnosis not present

## 2015-05-16 NOTE — Progress Notes (Signed)
Pre visit review using our clinic review tool, if applicable. No additional management support is needed unless otherwise documented below in the visit note. 

## 2015-05-16 NOTE — Progress Notes (Signed)
Subjective:    Patient ID: Timothy Harrell, male    DOB: May 31, 1955, 60 y.o.   MRN: [id number]  HPI Patient seen for complete physical Prostate cancer couple years ago and had total prostatectomy. Still has some erectile dysfunction and is followed by urology. Has decided to train for half marathon. Has made some dietary changes and is trying to restrict overall sugars and carbohydrates. He had some lose about 25 pounds.  Colonoscopy up-to-date. Immunizations up-to-date. Nonsmoker. Rare alcohol use.  Past Medical History  Diagnosis Date  . Allergy   . History of colonic polyps     hyperplastic  . Prostate cancer ([place]) 04/22/13    Gleason 6  . ED (erectile dysfunction) of organic origin   . Insomnia   . Sleep apnea     uses pt does not know settings   Past Surgical History  Procedure Laterality Date  . Vasectomy    . Prostate biopsy  04/22/2013    Gleason 6  . Robot assisted laparoscopic radical prostatectomy N/A 06/29/2013    Procedure: ROBOTIC ASSISTED LAPAROSCOPIC RADICAL PROSTATECTOMY LEVEL 1;  Surgeon: Dutch Gray, MD;  Location: [facility];  Service: Urology;  Laterality: N/A;    reports that he quit smoking about 16 years ago. His smoking use included Cigarettes and Cigars. He has a 7.5 pack-year smoking history. He has never used smokeless tobacco. He reports that he drinks alcohol. He reports that he does not use illicit drugs. family history includes Cancer in his father; Heart attack in his father; Stroke in his father. No Known Allergies    Review of Systems  Constitutional: Negative for fever, activity change, appetite change and fatigue.  HENT: Negative for congestion, ear pain and trouble swallowing.   Eyes: Negative for pain and visual disturbance.  Respiratory: Negative for cough, shortness of breath and wheezing.   Cardiovascular: Negative for chest pain and palpitations.  Gastrointestinal: Negative for nausea, vomiting, abdominal pain, diarrhea, constipation,  blood in stool, abdominal distention and rectal pain.  Genitourinary: Negative for dysuria, hematuria and testicular pain.  Musculoskeletal: Negative for joint swelling and arthralgias.  Skin: Negative for rash.  Neurological: Negative for dizziness, syncope and headaches.  Hematological: Negative for adenopathy.  Psychiatric/Behavioral: Negative for confusion and dysphoric mood.       Objective:   Physical Exam  Constitutional: He is oriented to person, place, and time. He appears well-developed and well-nourished. No distress.  HENT:  Head: Normocephalic and atraumatic.  Right Ear: External ear normal.  Left Ear: External ear normal.  Mouth/Throat: Oropharynx is clear and moist.  Eyes: Conjunctivae and EOM are normal. Pupils are equal, round, and reactive to light.  Neck: Normal range of motion. Neck supple. No thyromegaly present.  Cardiovascular: Normal rate, regular rhythm and normal heart sounds.   No murmur heard. Pulmonary/Chest: No respiratory distress. He has no wheezes. He has no rales.  Abdominal: Soft. Bowel sounds are normal. He exhibits no distension and no mass. There is no tenderness. There is no rebound and no guarding.  Musculoskeletal: He exhibits no edema.  Lymphadenopathy:    He has no cervical adenopathy.  Neurological: He is alert and oriented to person, place, and time. He displays normal reflexes. No cranial nerve deficit.  Skin: No rash noted.  Psychiatric: He has a normal mood and affect.          Assessment & Plan:  Physical exam. Labs reviewed. PSA is 0. Minimally elevated lipids. We discussed reduction in saturated fats. Establish more  consistent exercise as he plans to do. He is encouraged to lose some weight.

## 2015-06-19 IMAGING — CR DG CHEST 2V
2 series · 2 of 2 positions shown · non-contrast
Comparison: None.

CLINICAL DATA: Preop laparoscopic prostatectomy.

EXAM:
CHEST  2 VIEW

[w chest pa]
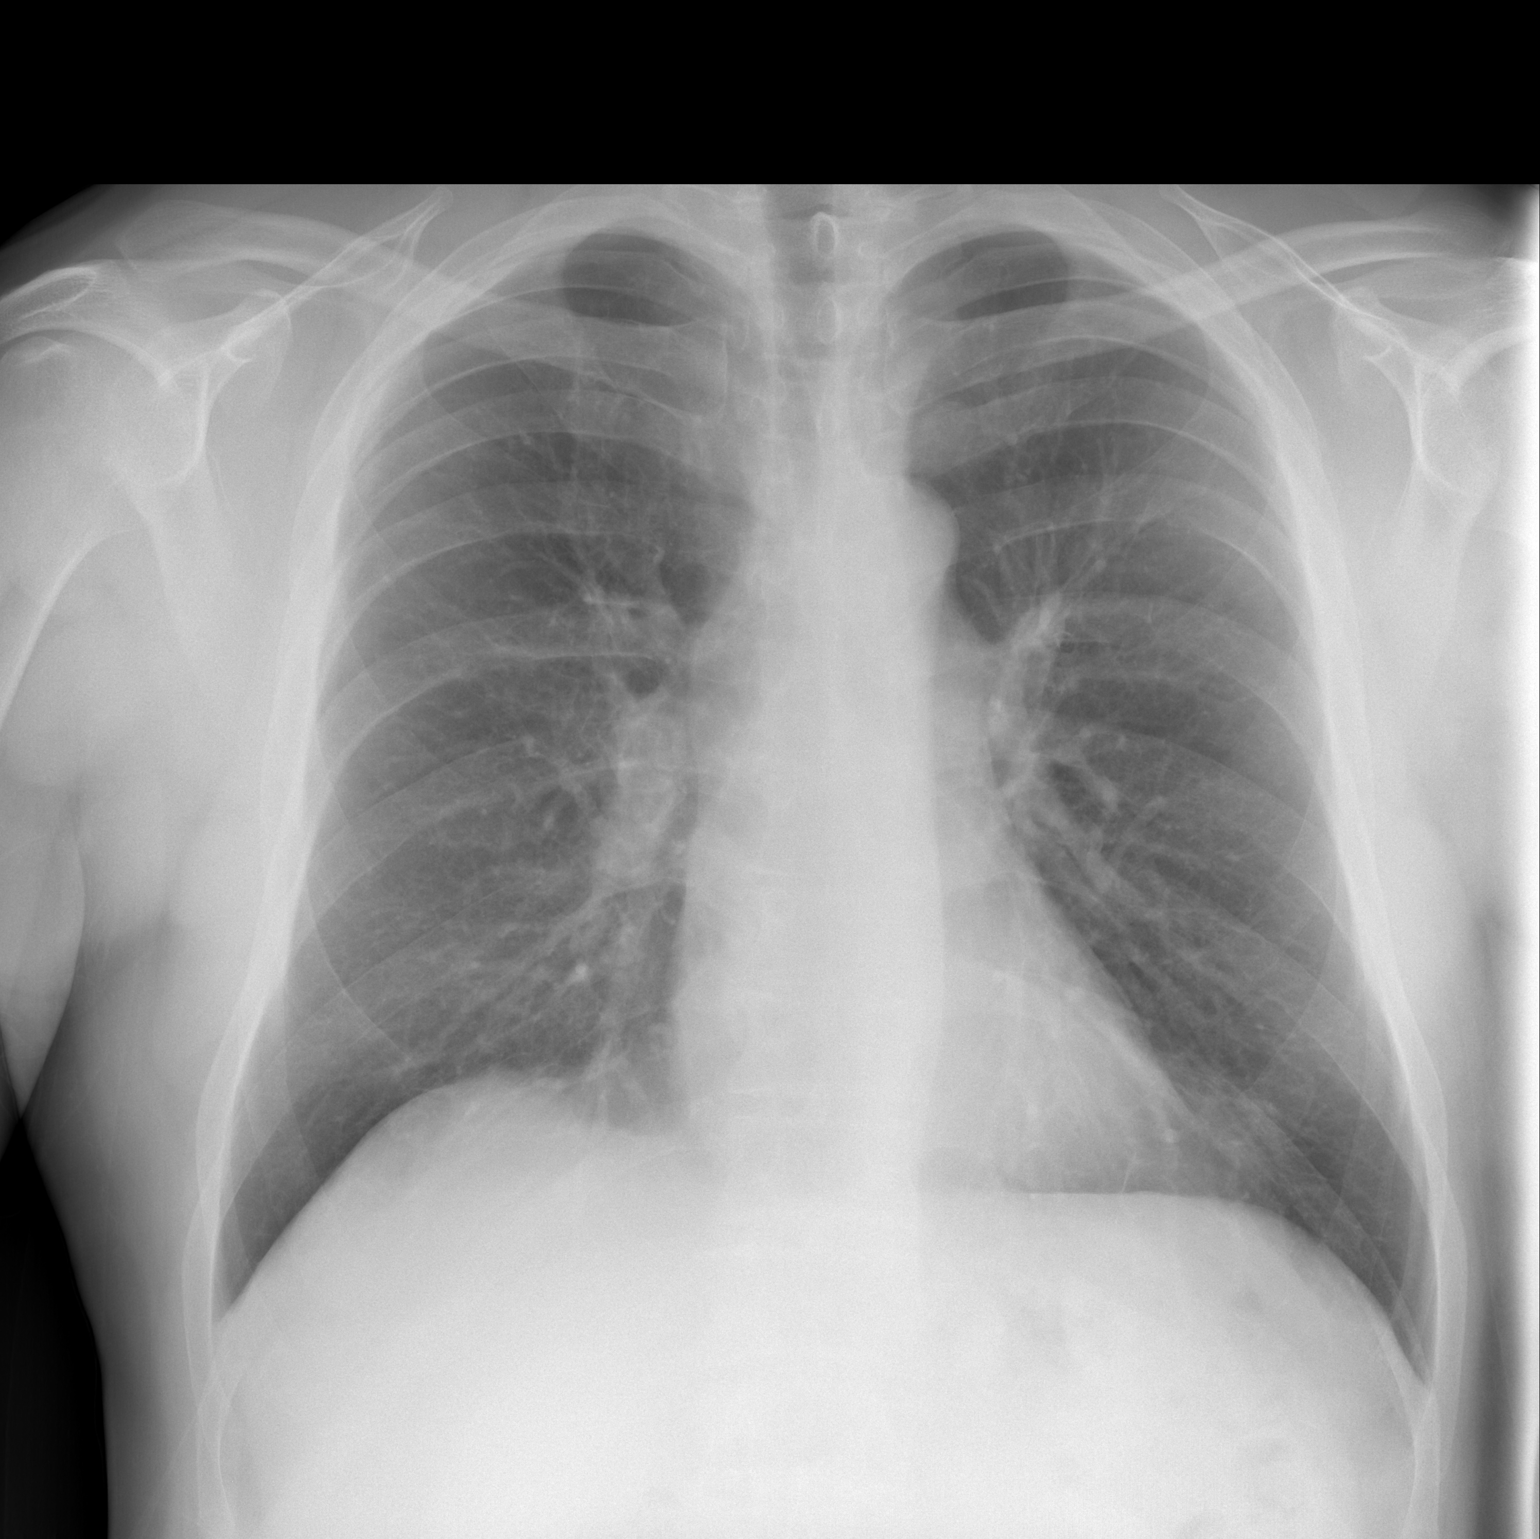

[w chest lat]
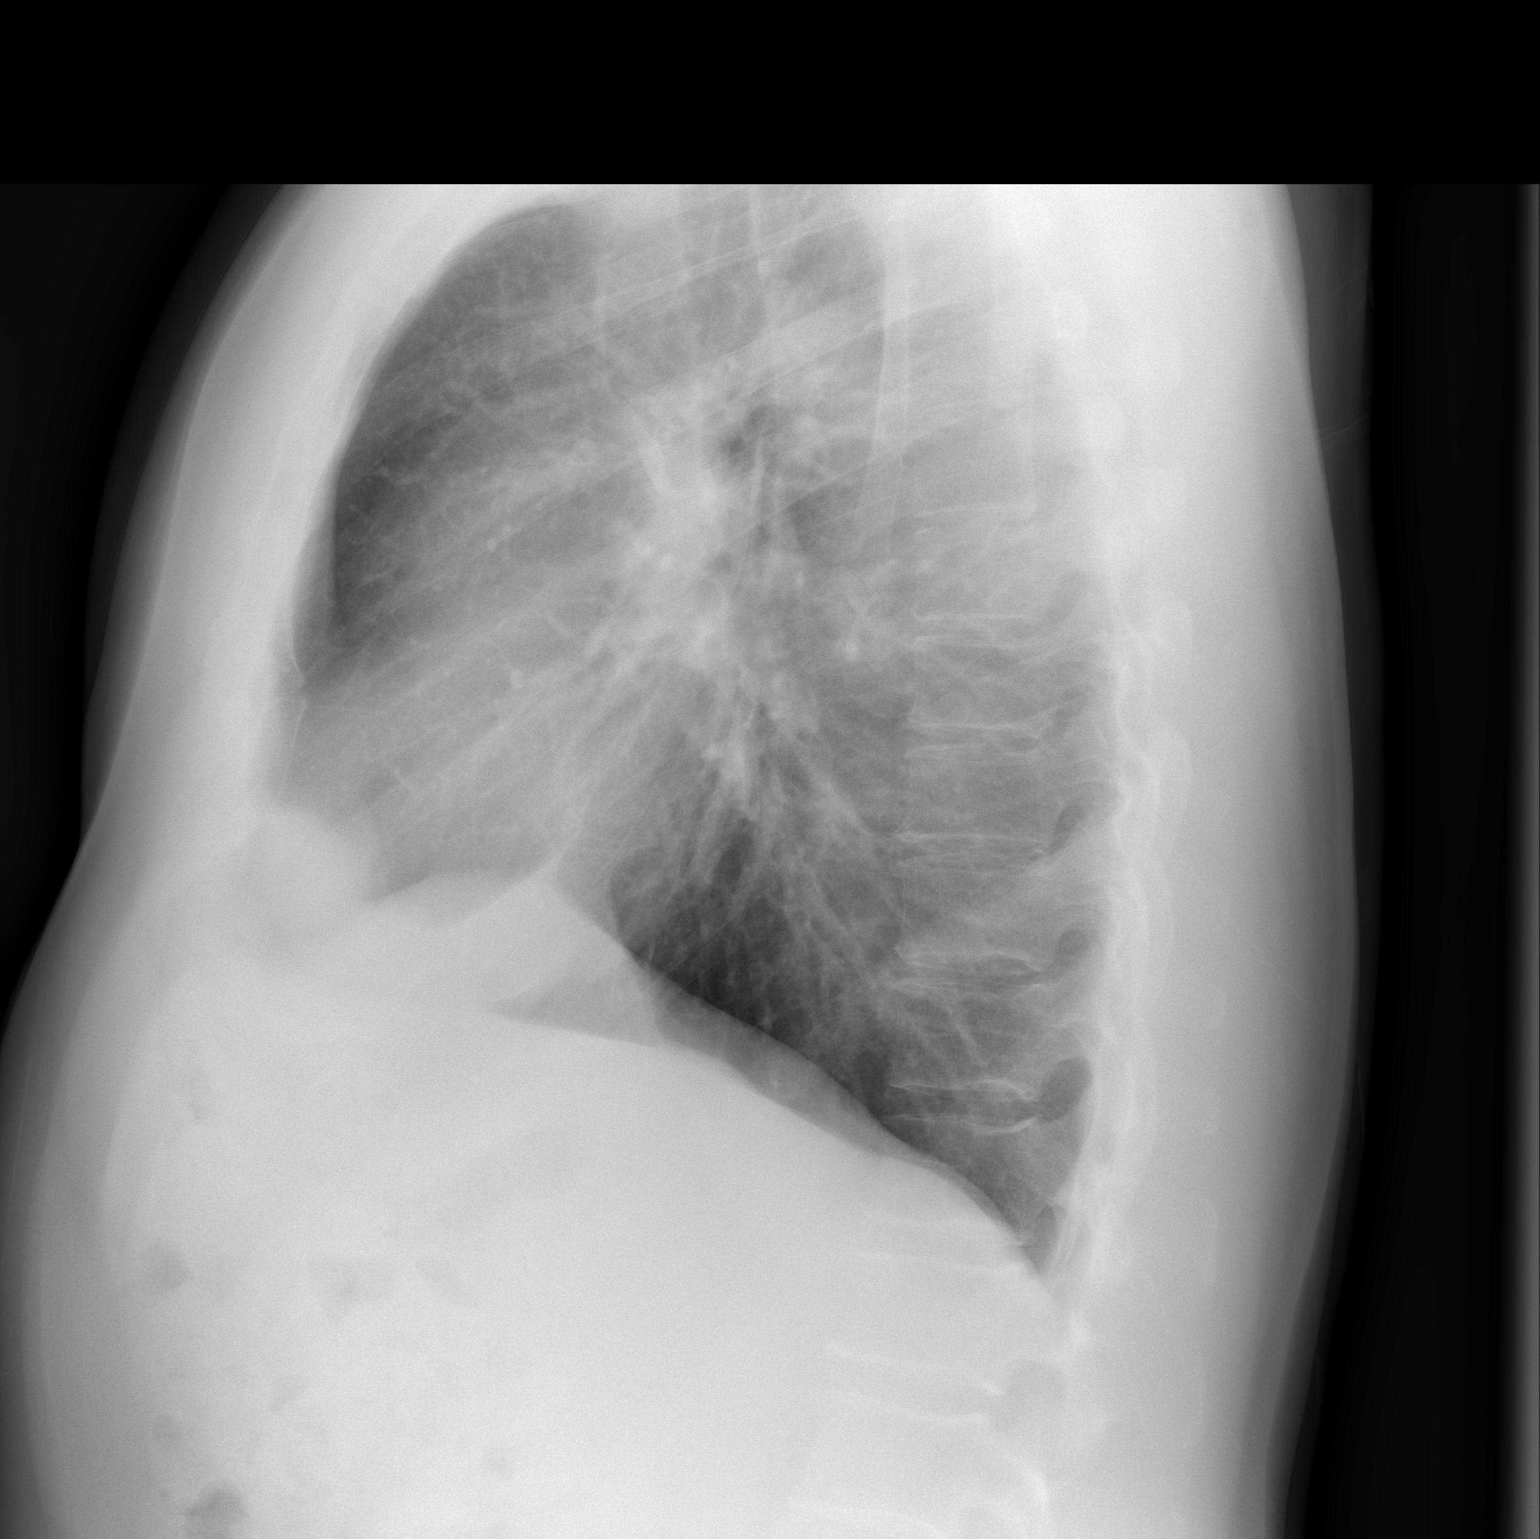

[2 of 2 positions shown; findings below may reference images not displayed]

FINDINGS: The cardiomediastinal silhouette is within normal limits. The lungs
are well inflated and clear. There is no evidence of pleural
effusion or pneumothorax. No acute osseous abnormality is
identified.
IMPRESSION: No active cardiopulmonary disease.

## 2015-07-09 ENCOUNTER — Other Ambulatory Visit: Payer: Self-pay | Admitting: Family Medicine

## 2015-07-11 NOTE — Telephone Encounter (Signed)
Refill done.  

## 2015-08-07 ENCOUNTER — Other Ambulatory Visit: Payer: Self-pay | Admitting: Family Medicine

## 2015-08-09 NOTE — Telephone Encounter (Signed)
Pt has not been seen in over a year. Refill denied.  

## 2016-07-20 ENCOUNTER — Encounter: Payer: Self-pay | Admitting: Family Medicine

## 2016-07-20 ENCOUNTER — Ambulatory Visit (INDEPENDENT_AMBULATORY_CARE_PROVIDER_SITE_OTHER): Payer: 59 | Admitting: Family Medicine

## 2016-07-20 VITALS — BP 140/80 | HR 82 | Temp 98.7°F | Wt 232.8 lb

## 2016-07-20 DIAGNOSIS — L237 Allergic contact dermatitis due to plants, except food: Secondary | ICD-10-CM | POA: Diagnosis not present

## 2016-07-20 MED ORDER — PREDNISONE 10 MG PO TABS: ORAL_TABLET | ORAL | 0 refills | Status: DC

## 2016-07-20 NOTE — Patient Instructions (Signed)
Contact Dermatitis Dermatitis is redness, soreness, and swelling (inflammation) of the skin. Contact dermatitis is a reaction to certain substances that touch the skin. There are two types of contact dermatitis:  Irritant contact dermatitis. This type is caused by something that irritates your skin, such as dry hands from washing them too much. This type does not require previous exposure to the substance for a reaction to occur. This type is more common.  Allergic contact dermatitis. This type is caused by a substance that you are allergic to, such as a nickel allergy or poison ivy. This type only occurs if you have been exposed to the substance (allergen) before. Upon a repeat exposure, your body reacts to the substance. This type is less common. What are the causes? Many different substances can cause contact dermatitis. Irritant contact dermatitis is most commonly caused by exposure to:  Makeup.  Soaps.  Detergents.  Bleaches.  Acids.  Metal salts, such as nickel. Allergic contact dermatitis is most commonly caused by exposure to:  Poisonous plants.  Chemicals.  Jewelry.  Latex.  Medicines.  Preservatives in products, such as clothing. What increases the risk? This condition is more likely to develop in:  People who have jobs that expose them to irritants or allergens.  People who have certain medical conditions, such as asthma or eczema. What are the signs or symptoms? Symptoms of this condition may occur anywhere on your body where the irritant has touched you or is touched by you. Symptoms include:  Dryness or flaking.  Redness.  Cracks.  Itching.  Pain or a burning feeling.  Blisters.  Drainage of small amounts of blood or clear fluid from skin cracks. With allergic contact dermatitis, there may also be swelling in areas such as the eyelids, mouth, or genitals. How is this diagnosed? This condition is diagnosed with a medical history and physical exam.  A patch skin test may be performed to help determine the cause. If the condition is related to your job, you may need to see an occupational medicine specialist. How is this treated? Treatment for this condition includes figuring out what caused the reaction and protecting your skin from further contact. Treatment may also include:  Steroid creams or ointments. Oral steroid medicines may be needed in more severe cases.  Antibiotics or antibacterial ointments, if a skin infection is present.  Antihistamine lotion or an antihistamine taken by mouth to ease itching.  A bandage (dressing). Follow these instructions at home: [place] your skin as needed.  Apply cool compresses to the affected areas.  Try taking a bath with:  Epsom salts. Follow the instructions on the packaging. You can get these at your local pharmacy or grocery store.  Baking soda. Pour a small amount into the bath as directed by your health care provider.  Colloidal oatmeal. Follow the instructions on the packaging. You can get this at your local pharmacy or grocery store.  Try applying baking soda paste to your skin. Stir water into baking soda until it reaches a paste-like consistency.  Do not scratch your skin.  Bathe less frequently, such as every other day.  Bathe in lukewarm water. Avoid using hot water. Medicines   Take or apply over-the-counter and prescription medicines only as told by your health care provider.  If you were prescribed an antibiotic medicine, take or apply your antibiotic as told by your health care provider. Do not stop using the antibiotic even if your condition starts to improve. General  instructions   Keep all follow-up visits as told by your health care provider. This is important.  Avoid the substance that caused your reaction. If you do not know what caused it, keep a journal to try to track what caused it. Write down:  What you eat.  What cosmetic products  you use.  What you drink.  What you wear in the affected area. This includes jewelry.  If you were given a dressing, take care of it as told by your health care provider. This includes when to change and remove it. Contact a health care provider if:  Your condition does not improve with treatment.  Your condition gets worse.  You have signs of infection such as swelling, tenderness, redness, soreness, or warmth in the affected area.  You have a fever.  You have new symptoms. Get help right away if:  You have a severe headache, neck pain, or neck stiffness.  You vomit.  You feel very sleepy.  You notice red streaks coming from the affected area.  Your bone or joint underneath the affected area becomes painful after the skin has healed.  The affected area turns darker.  You have difficulty breathing. This information is not intended to replace advice given to you by your health care provider. Make sure you discuss any questions you have with your health care provider. Document Released: 02/24/2000 Document Revised: 08/04/2015 Document Reviewed: 07/14/2014 Elsevier Interactive Patient Education  2017 [employer].

## 2016-07-20 NOTE — Progress Notes (Signed)
Subjective:     Patient ID: Timothy Harrell, male   DOB: 1955/07/05, 61 y.o.   MRN: [id number]  HPI Patient seen as a work in with pruritic rash started a few days ago. Started on his right posterior thigh and now has areas breaking out on both arms and forearms. He has a pruritic vesicular rash consistent with previous episodes of contact dermatitis. No fevers or chills. Used calamine without much relief. Tolerated prednisone in the past. No history of diabetes.  Past Medical History:  Diagnosis Date  . Allergy   . ED (erectile dysfunction) of organic origin   . History of colonic polyps    hyperplastic  . Insomnia   . Prostate cancer ([place]) 04/22/13   Gleason 6  . Sleep apnea    uses pt does not know settings   Past Surgical History:  Procedure Laterality Date  . PROSTATE BIOPSY  04/22/2013   Gleason 6  . ROBOT ASSISTED LAPAROSCOPIC RADICAL PROSTATECTOMY N/A 06/29/2013   Procedure: ROBOTIC ASSISTED LAPAROSCOPIC RADICAL PROSTATECTOMY LEVEL 1;  Surgeon: Dutch Gray, MD;  Location: [facility];  Service: Urology;  Laterality: N/A;  . VASECTOMY      reports that he quit smoking about 17 years ago. His smoking use included Cigarettes and Cigars. He has a 7.50 pack-year smoking history. He has never used smokeless tobacco. He reports that he drinks alcohol. He reports that he does not use drugs. family history includes Cancer in his father; Heart attack in his father; Stroke in his father. No Known Allergies   Review of Systems  Constitutional: Negative for chills and fever.  Skin: Positive for rash.       Objective:   Physical Exam  Constitutional: He appears well-developed and well-nourished.  Cardiovascular: Normal rate and regular rhythm.   Skin:  Patient has diffuse rash including right thigh and upper extremities. This is erythematous base and vesicular surface       Assessment:     Contact dermatitis    Plan:     -Prednisone taper over 12 days. -Follow-up as  needed  Eulas Post MD [facility]

## 2016-09-23 ENCOUNTER — Other Ambulatory Visit: Payer: Self-pay | Admitting: Family Medicine

## 2016-09-25 ENCOUNTER — Telehealth: Payer: Self-pay | Admitting: Family Medicine

## 2016-09-25 DIAGNOSIS — L237 Allergic contact dermatitis due to plants, except food: Secondary | ICD-10-CM

## 2016-09-25 NOTE — Telephone Encounter (Signed)
Refill once but needs to be seen if not clearing.

## 2016-09-25 NOTE — Telephone Encounter (Signed)
Dr. Burchette - Please advise. Thanks! 

## 2016-09-25 NOTE — Telephone Encounter (Signed)
Pt was last seen in may 2018. Pt has come in contact with poison ivy again and would like prednisone send in to [facility] [place] [address]

## 2016-09-26 MED ORDER — PREDNISONE 10 MG PO TABS: ORAL_TABLET | ORAL | 0 refills | Status: DC

## 2016-09-26 NOTE — Telephone Encounter (Signed)
Antony Madura pt returned your call.

## 2016-09-26 NOTE — Telephone Encounter (Signed)
Notified patient of results; understanding voiced.

## 2016-09-26 NOTE — Telephone Encounter (Signed)
Notified patient rx sent to pharmacy.

## 2016-09-26 NOTE — Telephone Encounter (Signed)
Sent requested prednisone to pharmacy per PCP orders. Left message to notify patient.

## 2017-01-23 ENCOUNTER — Encounter: Payer: Self-pay | Admitting: Family Medicine

## 2017-01-23 ENCOUNTER — Ambulatory Visit (INDEPENDENT_AMBULATORY_CARE_PROVIDER_SITE_OTHER): Payer: 59 | Admitting: Family Medicine

## 2017-01-23 ENCOUNTER — Encounter: Payer: Self-pay | Admitting: Gastroenterology

## 2017-01-23 VITALS — BP 110/80 | HR 79 | Temp 98.6°F | Ht 74.0 in | Wt 230.3 lb

## 2017-01-23 DIAGNOSIS — Z23 Encounter for immunization: Secondary | ICD-10-CM | POA: Diagnosis not present

## 2017-01-23 DIAGNOSIS — Z0000 Encounter for general adult medical examination without abnormal findings: Secondary | ICD-10-CM | POA: Diagnosis not present

## 2017-01-23 NOTE — Progress Notes (Signed)
Subjective:     Patient ID: Timothy Harrell, male   DOB: 1955/04/21, 61 y.o.   MRN: [id number]  HPI Patient seen for physical exam. He has history of prostate cancer and followed by urology regularly.  He takes topical testosterone replacement per urology. He has history of erectile dysfunction. He had recent fall with left radius fracture. Patient had colonoscopy back in 2009. He is trying to get follow-up colonoscopy this year if possible before insurance runs out. He changes plans next year.    Tetanus up-to-date. Declines shingles vaccine.  Past Medical History:  Diagnosis Date  . Allergy   . ED (erectile dysfunction) of organic origin   . History of colonic polyps    hyperplastic  . Insomnia   . 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 Sleep apnea    uses pt does not know settings   Past Surgical History:  Procedure Laterality Date  . PROSTATE BIOPSY  04/22/2013   Gleason 6  . VASECTOMY      reports that he quit smoking about 17 years ago. His smoking use included cigarettes and cigars. He has a 7.50 pack-year smoking history. he has never used smokeless tobacco. He reports that he drinks alcohol. He reports that he does not use drugs. family history includes Cancer in his father; Heart attack in his father; Stroke in his father. No Known Allergies'   Review of Systems  Constitutional: Negative for activity change, appetite change, fatigue and fever.  HENT: Negative for congestion, ear pain and trouble swallowing.   Eyes: Negative for pain and visual disturbance.  Respiratory: Negative for cough, shortness of breath and wheezing.   Cardiovascular: Negative for chest pain and palpitations.  Gastrointestinal: Negative for abdominal distention, abdominal pain, blood in stool, constipation, diarrhea, nausea, rectal pain and vomiting.  Genitourinary: Negative for dysuria, hematuria and testicular pain.  Musculoskeletal: Negative for arthralgias and joint swelling.  Skin:  Negative for rash.  Neurological: Negative for dizziness, syncope and headaches.  Hematological: Negative for adenopathy.  Psychiatric/Behavioral: Negative for confusion and dysphoric mood.       Objective:   Physical Exam  Constitutional: He is oriented to person, place, and time. He appears well-developed and well-nourished. No distress.  HENT:  Head: Normocephalic and atraumatic.  Right Ear: External ear normal.  Left Ear: External ear normal.  Mouth/Throat: Oropharynx is clear and moist.  Eyes: Conjunctivae and EOM are normal. Pupils are equal, round, and reactive to light.  Neck: Normal range of motion. Neck supple. No thyromegaly present.  Cardiovascular: Normal rate, regular rhythm and normal heart sounds.  No murmur heard. Pulmonary/Chest: No respiratory distress. He has no wheezes. He has no rales.  Abdominal: Soft. Bowel sounds are normal. He exhibits no distension and no mass. There is no tenderness. There is no rebound and no guarding.  Musculoskeletal:  Patient has cast in place left upper extremity secondary to radial fracture. He has some edema of the digits of the left hand but good capillary refill  Lymphadenopathy:    He has no cervical adenopathy.  Neurological: He is alert and oriented to person, place, and time. He displays normal reflexes. No cranial nerve deficit.  Skin: No rash noted.  Psychiatric: He has a normal mood and affect.       Assessment:     Physical exam. Issues addressed as below     Plan:     -Patient already gets regular PSAs every 6 months through urology and will defer  to them -He declines other lab work today. Other screening labs were normal last year -Discuss new shingles vaccine and he declines -We'll try to set up GI referral to see if he can get approval for colonoscopy. Not quite 10 years but he states he checked with his insurance, and they said they would cover this.  Eulas Post MD [facility] at  [facility]

## 2017-01-23 NOTE — Patient Instructions (Signed)
I will contact GI to see about getting colonoscopy set up.

## 2017-02-20 ENCOUNTER — Ambulatory Visit (AMBULATORY_SURGERY_CENTER): Payer: Self-pay | Admitting: *Deleted

## 2017-02-20 ENCOUNTER — Other Ambulatory Visit: Payer: Self-pay

## 2017-02-20 VITALS — Ht 74.0 in | Wt 235.0 lb

## 2017-02-20 DIAGNOSIS — Z1211 Encounter for screening for malignant neoplasm of colon: Secondary | ICD-10-CM

## 2017-02-20 MED ORDER — PEG-KCL-NACL-NASULF-NA ASC-C 140 G PO SOLR: 1.0000 | ORAL | 0 refills | Status: DC

## 2017-02-20 NOTE — Progress Notes (Signed)
No egg or soy allergy known to patient  No issues with past sedation with any surgeries  or procedures, no intubation problems  No diet pills per patient No home 02 use per patient  No blood thinners per patient  Pt denies issues with constipation  No A fib or A flutter  EMMI video sent to pt's e mail  Gave pt a pay no more than 50 coupon for plenvu- instructed to activate coupon prior to taking to pharmacy

## 2017-02-25 ENCOUNTER — Telehealth: Payer: Self-pay | Admitting: Gastroenterology

## 2017-02-25 NOTE — Telephone Encounter (Signed)
Patient states that he is at the pharmacy now and is requesting callback. Best # [phone]

## 2017-02-25 NOTE — Telephone Encounter (Signed)
Spoke with patient. Coupon worked per pt while I was on the phone with him.

## 2017-02-25 NOTE — Telephone Encounter (Signed)
Patient states ins will cover plenvu with prior auth. Pt notified that our office does not usually do prior auths for prep. Please advise.

## 2017-02-25 NOTE — Telephone Encounter (Signed)
Called [facility]- prep declined by insurance, coupon not working. Called pt- he states he could not activate the coupon- he will come by [facility] and get a second coupon to activate- will tell pt to activate via web site if possible - call with further issues - I did tell pharmacist they have to run as secondary insurance once they have the coupon.   Lelan Pons PV

## 2017-03-01 ENCOUNTER — Other Ambulatory Visit: Payer: Self-pay

## 2017-03-01 ENCOUNTER — Ambulatory Visit (AMBULATORY_SURGERY_CENTER): Payer: 59 | Admitting: Gastroenterology

## 2017-03-01 ENCOUNTER — Encounter: Payer: Self-pay | Admitting: Gastroenterology

## 2017-03-01 VITALS — BP 129/79 | HR 67 | Temp 99.1°F | Resp 11 | Ht 74.0 in | Wt 235.0 lb

## 2017-03-01 DIAGNOSIS — Z1211 Encounter for screening for malignant neoplasm of colon: Secondary | ICD-10-CM

## 2017-03-01 DIAGNOSIS — K621 Rectal polyp: Secondary | ICD-10-CM

## 2017-03-01 DIAGNOSIS — D122 Benign neoplasm of ascending colon: Secondary | ICD-10-CM | POA: Diagnosis not present

## 2017-03-01 DIAGNOSIS — D123 Benign neoplasm of transverse colon: Secondary | ICD-10-CM

## 2017-03-01 DIAGNOSIS — D128 Benign neoplasm of rectum: Secondary | ICD-10-CM

## 2017-03-01 MED ORDER — SODIUM CHLORIDE 0.9 % IV SOLN: 500.0000 mL | INTRAVENOUS | Status: DC

## 2017-03-01 NOTE — Op Note (Signed)
[place] Patient Name: Timothy Harrell Procedure Date: 03/01/2017 2:05 PM MRN: [id number] Endoscopist: Mauri Pole , MD Age: 61 Referring MD:  Date of Birth: 1955/08/23 Gender: Male Account #: [id number] Procedure:                Colonoscopy Indications:              Screening for colorectal malignant neoplasm Medicines:                Monitored Anesthesia Care Procedure:                Pre-Anesthesia Assessment:                           - Prior to the procedure, a History and Physical                            was performed, and patient medications and                            allergies were reviewed. The patient's tolerance of                            previous anesthesia was also reviewed. The risks                            and benefits of the procedure and the sedation                            options and risks were discussed with the patient.                            All questions were answered, and informed consent                            was obtained. Prior Anticoagulants: The patient has                            taken no previous anticoagulant or antiplatelet                            agents. ASA Grade Assessment: II - A patient with                            mild systemic disease. After reviewing the risks                            and benefits, the patient was deemed in                            satisfactory condition to undergo the procedure.                           After obtaining informed consent, the colonoscope  was passed under direct vision. Throughout the                            procedure, the patient's blood pressure, pulse, and                            oxygen saturations were monitored continuously. The                            Colonoscope was introduced through the anus and                            advanced to the the cecum, identified by                            appendiceal orifice  and ileocecal valve. The                            colonoscopy was performed without difficulty. The                            patient tolerated the procedure well. The quality                            of the bowel preparation was excellent. The                            ileocecal valve, appendiceal orifice, and rectum                            were photographed. Scope In: 2:10:01 PM Scope Out: 2:29:55 PM Scope Withdrawal Time: 0 hours 13 minutes 40 seconds  Total Procedure Duration: 0 hours 19 minutes 54 seconds  Findings:                 The perianal and digital rectal examinations were                            normal.                           Two sessile polyps were found in the transverse                            colon and ascending colon. The polyps were 1 to 2                            mm in size. These polyps were removed with a cold                            biopsy forceps. Resection and retrieval were                            complete.  Three sessile polyps were found in the rectum and                            transverse colon. The polyps were 5 to 7 mm in                            size. These polyps were removed with a cold snare.                            Resection and retrieval were complete.                           Non-bleeding internal hemorrhoids were found during                            retroflexion. The hemorrhoids were small. Complications:            No immediate complications. Estimated Blood Loss:     Estimated blood loss was minimal. Impression:               - Two 1 to 2 mm polyps in the transverse colon and                            in the ascending colon, removed with a cold biopsy                            forceps. Resected and retrieved.                           - Three 5 to 7 mm polyps in the rectum and in the                            transverse colon, removed with a cold snare.                             Resected and retrieved.                           - Non-bleeding internal hemorrhoids. Recommendation:           - Patient has a contact number available for                            emergencies. The signs and symptoms of potential                            delayed complications were discussed with the                            patient. Return to normal activities tomorrow.                            Written discharge instructions were provided to the  patient.                           - Resume previous diet.                           - Continue present medications.                           - Await pathology results.                           - Repeat colonoscopy in 3 - 5 years for                            surveillance based on pathology results. Mauri Pole, MD 03/01/2017 2:35:59 PM This report has been signed electronically.

## 2017-03-01 NOTE — Progress Notes (Signed)
Report to PACU, RN, vss, BBS= Clear.  

## 2017-03-01 NOTE — Progress Notes (Signed)
Called to room to assist during endoscopic procedure.  Patient ID and intended procedure confirmed with present staff. Received instructions for my participation in the procedure from the performing physician.  

## 2017-03-01 NOTE — Patient Instructions (Signed)
**  Handouts given on polyps and hemorrhoids**   YOU HAD AN ENDOSCOPIC PROCEDURE TODAY: Refer to the procedure report and other information in the discharge instructions given to you for any specific questions about what was found during the examination. If this information does not answer your questions, please call [place] office at [phone] to clarify.   YOU SHOULD EXPECT: Some feelings of bloating in the abdomen. Passage of more gas than usual. Walking can help get rid of the air that was put into your GI tract during the procedure and reduce the bloating. If you had a lower endoscopy (such as a colonoscopy or flexible sigmoidoscopy) you may notice spotting of blood in your stool or on the toilet paper. Some abdominal soreness may be present for a day or two, also.  DIET: Your first meal following the procedure should be a light meal and then it is ok to progress to your normal diet. A half-sandwich or bowl of soup is an example of a good first meal. Heavy or fried foods are harder to digest and may make you feel nauseous or bloated. Drink plenty of fluids but you should avoid alcoholic beverages for 24 hours. If you had a esophageal dilation, please see attached instructions for diet.    ACTIVITY: Your care partner should take you home directly after the procedure. You should plan to take it easy, moving slowly for the rest of the day. You can resume normal activity the day after the procedure however YOU SHOULD NOT DRIVE, use power tools, machinery or perform tasks that involve climbing or major physical exertion for 24 hours (because of the sedation medicines used during the test).   SYMPTOMS TO REPORT IMMEDIATELY: A gastroenterologist can be reached at any hour. Please call [phone]  for any of the following symptoms:  Following lower endoscopy (colonoscopy, flexible sigmoidoscopy) Excessive amounts of blood in the stool  Significant tenderness, worsening of abdominal pains  Swelling of  the abdomen that is new, acute  Fever of 100 or higher    FOLLOW UP:  If any biopsies were taken you will be contacted by phone or by letter within the next 1-3 weeks. Call [phone]  if you have not heard about the biopsies in 3 weeks.  Please also call with any specific questions about appointments or follow up tests.  

## 2017-03-04 ENCOUNTER — Telehealth: Payer: Self-pay | Admitting: *Deleted

## 2017-03-04 NOTE — Telephone Encounter (Signed)
  Follow up Call-  Call back number 03/01/2017  Post procedure Call Back phone  # [phone]  Permission to leave phone message Yes  Some recent data might be hidden     Patient questions:  Do you have a fever, pain , or abdominal swelling? No. Pain Score  0 *  Have you tolerated food without any problems? Yes.    Have you been able to return to your normal activities? Yes.    Do you have any questions about your discharge instructions: Diet   No. Medications  No. Follow up visit  No.  Do you have questions or concerns about your Care? No.  Actions: * If pain score is 4 or above: No action needed, pain <4.

## 2017-03-07 ENCOUNTER — Encounter: Payer: Self-pay | Admitting: Gastroenterology

## 2019-12-11 ENCOUNTER — Other Ambulatory Visit: Payer: Self-pay

## 2019-12-11 DIAGNOSIS — Z20822 Contact with and (suspected) exposure to covid-19: Secondary | ICD-10-CM

## 2019-12-13 LAB — SARS-COV-2, NAA 2 DAY TAT

## 2019-12-13 LAB — NOVEL CORONAVIRUS, NAA: SARS-CoV-2, NAA: NOT DETECTED

## 2020-04-04 ENCOUNTER — Other Ambulatory Visit: Payer: Managed Care, Other (non HMO)

## 2020-04-04 DIAGNOSIS — Z20822 Contact with and (suspected) exposure to covid-19: Secondary | ICD-10-CM

## 2020-04-05 ENCOUNTER — Other Ambulatory Visit: Payer: Managed Care, Other (non HMO)

## 2020-04-05 LAB — SARS-COV-2, NAA 2 DAY TAT

## 2020-04-05 LAB — NOVEL CORONAVIRUS, NAA: SARS-CoV-2, NAA: NOT DETECTED

## 2023-01-23 ENCOUNTER — Encounter: Payer: Self-pay | Admitting: Gastroenterology

## 2023-03-20 ENCOUNTER — Ambulatory Visit (AMBULATORY_SURGERY_CENTER): Payer: PRIVATE HEALTH INSURANCE | Admitting: *Deleted

## 2023-03-20 VITALS — Ht 74.0 in | Wt 230.0 lb

## 2023-03-20 DIAGNOSIS — Z860100 Personal history of colon polyps, unspecified: Secondary | ICD-10-CM

## 2023-03-20 MED ORDER — NA SULFATE-K SULFATE-MG SULF 17.5-3.13-1.6 GM/177ML PO SOLN: 1.0000 | Freq: Once | ORAL | 0 refills | Status: AC

## 2023-03-20 NOTE — Progress Notes (Signed)
 Pt's name and DOB verified at the beginning of the pre-visit wit 2 identifiers  Pt denies any difficulty with ambulating,sitting, laying down or rolling side to side  Pt has no issues with ambulation   Pt has no issues moving head neck or swallowing  No egg or soy allergy known to patient   No issues known to pt with past sedation with any surgeries or procedures  Pt denies having issues being intubated  No FH of Malignant Hyperthermia  Pt is not on diet pills or shots  Pt is not on home 02   Pt is not on blood thinners   Pt denies issues with constipation   Pt is not on dialysis  Pt denise any abnormal heart rhythms   Pt denies any upcoming cardiac testing  Pt encouraged to use to use Singlecare or Goodrx to reduce cost   Patient's chart reviewed by Cathlyn Parsons CNRA prior to pre-visit and patient appropriate for the [facility].  Pre-visit completed and red dot placed by patient's name on their procedure day (on provider's schedule).  .  Visit by phone  Pt states weight is 230 lb  Instructed pt why it is important to and  to call if they have any changes in health or new medications. Directed them to the # given and on instructions.     Instructions reviewed. Pt given both [facility] main # and MD on call # prior to instructions.  Pt states understanding. Instructed to review again prior to procedure. Pt states they will.   Instructions sent by mail with coupon and by My Chart  Coupon sent via text to mobile phone and pt verified they received it

## 2023-04-03 ENCOUNTER — Encounter: Payer: Self-pay | Admitting: Gastroenterology

## 2023-04-03 ENCOUNTER — Ambulatory Visit: Payer: Medicare Other | Admitting: Gastroenterology

## 2023-04-03 VITALS — BP 129/65 | HR 54 | Temp 98.2°F | Resp 12 | Ht 74.0 in | Wt 230.0 lb

## 2023-04-03 DIAGNOSIS — Z860101 Personal history of adenomatous and serrated colon polyps: Secondary | ICD-10-CM

## 2023-04-03 DIAGNOSIS — D122 Benign neoplasm of ascending colon: Secondary | ICD-10-CM | POA: Diagnosis not present

## 2023-04-03 DIAGNOSIS — Z860100 Personal history of colon polyps, unspecified: Secondary | ICD-10-CM

## 2023-04-03 DIAGNOSIS — K644 Residual hemorrhoidal skin tags: Secondary | ICD-10-CM

## 2023-04-03 DIAGNOSIS — K621 Rectal polyp: Secondary | ICD-10-CM

## 2023-04-03 DIAGNOSIS — Z1211 Encounter for screening for malignant neoplasm of colon: Secondary | ICD-10-CM

## 2023-04-03 DIAGNOSIS — D123 Benign neoplasm of transverse colon: Secondary | ICD-10-CM | POA: Diagnosis not present

## 2023-04-03 DIAGNOSIS — K5730 Diverticulosis of large intestine without perforation or abscess without bleeding: Secondary | ICD-10-CM

## 2023-04-03 DIAGNOSIS — D128 Benign neoplasm of rectum: Secondary | ICD-10-CM

## 2023-04-03 DIAGNOSIS — K648 Other hemorrhoids: Secondary | ICD-10-CM

## 2023-04-03 MED ORDER — SODIUM CHLORIDE 0.9 % IV SOLN: 500.0000 mL | Freq: Once | INTRAVENOUS | Status: DC

## 2023-04-03 NOTE — Progress Notes (Signed)
Pt's states no medical or surgical changes since previsit or office visit. 

## 2023-04-03 NOTE — Progress Notes (Signed)
[facility] History and Physical   Primary Care Physician:  Redmond School, NP   Reason for Procedure:  History of adenomatous colon polyps  Plan:    Surveillance colonoscopy with possible interventions as needed     HPI: Timothy Harrell is a very pleasant 68 y.o. male here for surveillance colonoscopy. Denies any nausea, vomiting, abdominal pain, melena or bright red blood per rectum  The risks and benefits as well as alternatives of endoscopic procedure(s) have been discussed and reviewed. All questions answered. The patient agrees to proceed.    Past Medical History:  Diagnosis Date   Allergy    ED (erectile dysfunction) of organic origin    History of colonic polyps    hyperplastic   Insomnia    Prostate cancer ([facility]) 04/22/13   Gleason 6   Sleep apnea    uses pt does not know settings-wears cpap    Past Surgical History:  Procedure Laterality Date   COLONOSCOPY     POLYPECTOMY     2009 [facility]    PROSTATE BIOPSY  04/22/2013   Gleason 6   ROBOT ASSISTED LAPAROSCOPIC RADICAL PROSTATECTOMY N/A 06/29/2013   Procedure: ROBOTIC ASSISTED LAPAROSCOPIC RADICAL PROSTATECTOMY LEVEL 1;  Surgeon: Crecencio Mc, MD;  Location: [facility];  Service: Urology;  Laterality: N/A;   VASECTOMY      Prior to Admission medications   Medication Sig Start Date End Date Taking? Authorizing Provider  cholecalciferol (VITAMIN D) 1000 units tablet Take 1,000 Units by mouth daily.   Yes [provider]  cyanocobalamin 100 MCG tablet Take by mouth. 01/28/19  Yes [provider]  Multiple Vitamin (MULTI-VITAMINS) TABS Take by mouth.   Yes [provider]  Testosterone 10 MG/ACT (2%) GEL 2 Act daily.  12/26/16  Yes [provider]  Biotin 10 MG CAPS Take 1 capsule by mouth daily. Patient not taking: Reported on 04/03/2023    [provider]  tadalafil (CIALIS) 20 MG tablet Take one tablet every 2 days.    [provider]  testosterone  cypionate (DEPOTESTOSTERONE CYPIONATE) 200 MG/ML injection  02/19/17   [provider]    Current Outpatient Medications  Medication Sig Dispense Refill   cholecalciferol (VITAMIN D) 1000 units tablet Take 1,000 Units by mouth daily.     cyanocobalamin 100 MCG tablet Take by mouth.     Multiple Vitamin (MULTI-VITAMINS) TABS Take by mouth.     Testosterone 10 MG/ACT (2%) GEL 2 Act daily.      Biotin 10 MG CAPS Take 1 capsule by mouth daily. (Patient not taking: Reported on 04/03/2023)     tadalafil (CIALIS) 20 MG tablet Take one tablet every 2 days.     testosterone cypionate (DEPOTESTOSTERONE CYPIONATE) 200 MG/ML injection      Current Facility-Administered Medications  Medication Dose Route Frequency Provider Last Rate Last Admin   0.9 %  sodium chloride infusion  500 mL Intravenous Once Napoleon Form, MD        Allergies as of 04/03/2023   (No Known Allergies)    Family History  Problem Relation Age of Onset   Cancer Father        prostate   Heart attack Father    Stroke Father    Prostate cancer Father    Colon cancer Neg Hx    Colon polyps Neg Hx    Rectal cancer Neg Hx    Stomach cancer Neg Hx    Esophageal cancer Neg Hx  Social History   Socioeconomic History   Marital status: Divorced    Spouse name: Not on file   Number of children: Not on file   Years of education: Not on file   Highest education level: Not on file  Occupational History   Occupation: manager//promotions    Employer: [employer]  Tobacco Use   Smoking status: Former    Current packs/day: 0.00    Average packs/day: 0.5 packs/day for 15.0 years (7.5 ttl pk-yrs)    Types: Cigarettes, Cigars    Start date: 03/12/1984    Quit date: 03/13/1999    Years since quitting: 24.0   Smokeless tobacco: Never   Tobacco comments:    Smokes cigars currently once every 2 week2  Vaping Use   Vaping status: Some Days  Substance and Sexual Activity   Alcohol use: Yes    Comment:  rarely   Drug use: No   Sexual activity: Not on file  Other Topics Concern   Not on file  Social History Narrative   Not on file   Social Drivers of Health   Financial Resource Strain: Low Risk  (12/16/2022)   Received from [facility]   Overall Financial Resource Strain (CARDIA)    Difficulty of Paying Living Expenses: Not hard at all  Food Insecurity: No Food Insecurity (12/16/2022)   Received from [facility]   Hunger Vital Sign    Worried About Running Out of Food in the Last Year: Never true    Ran Out of Food in the Last Year: Never true  Transportation Needs: No Transportation Needs (12/16/2022)   Received from [facility] - Transportation    Lack of Transportation (Medical): No    Lack of Transportation (Non-Medical): No  Physical Activity: Insufficiently Active (12/16/2022)   Received from [facility]   Exercise Vital Sign    Days of Exercise per Week: 2 days    Minutes of Exercise per Session: 60 min  Stress: Stress Concern Present (12/16/2022)   Received from [facility] - Occupational Stress Questionnaire    Feeling of Stress : To some extent  Social Connections: Moderately Integrated (12/16/2022)   Received from [facility]   Social Network    How would you rate your social network (family, work, friends)?: Adequate participation with social networks  Intimate Partner Violence: Not At Risk (12/16/2022)   Received from [facility]   HITS    Over the last 12 months how often did your partner physically hurt you?: Never    Over the last 12 months how often did your partner insult you or talk down to you?: Never    Over the last 12 months how often did your partner threaten you with physical harm?: Never    Over the last 12 months how often did your partner scream or curse at you?: Never    Review of Systems:  All other review of systems negative except as mentioned in the HPI.  Physical  Exam: Vital signs in last 24 hours: BP 129/67   Pulse 65   Temp 98.2 F (36.8 C)   Ht 6\' 2"  (1.88 m)   Wt 230 lb (104.3 kg)   SpO2 96%   BMI 29.53 kg/m  General:   Alert, NAD Lungs:  Clear .   Heart:  Regular rate and rhythm Abdomen:  Soft, nontender and nondistended. Neuro/Psych:  Alert and cooperative. Normal mood and affect. A and O  x 3  Reviewed labs, radiology imaging, old records and pertinent past GI work up  Patient is appropriate for planned procedure(s) and anesthesia in an ambulatory setting   K. Scherry Ran , MD [phone]

## 2023-04-03 NOTE — Progress Notes (Signed)
Called to room to assist during endoscopic procedure.  Patient ID and intended procedure confirmed with present staff. Received instructions for my participation in the procedure from the performing physician.  

## 2023-04-03 NOTE — Patient Instructions (Signed)
Resume previous diet and medications. Awaiting pathology results. Repeat Colonoscopy date to be determined based on pathology results. Handouts provided on Colon polyps, Diverticulosis and Hemorrhoids   YOU HAD AN ENDOSCOPIC PROCEDURE TODAY AT [facility]:   Refer to the procedure report that was given to you for any specific questions about what was found during the examination.  If the procedure report does not answer your questions, please call your gastroenterologist to clarify.  If you requested that your care partner not be given the details of your procedure findings, then the procedure report has been included in a sealed envelope for you to review at your convenience later.  YOU SHOULD EXPECT: Some feelings of bloating in the abdomen. Passage of more gas than usual.  Walking can help get rid of the air that was put into your GI tract during the procedure and reduce the bloating. If you had a lower endoscopy (such as a colonoscopy or flexible sigmoidoscopy) you may notice spotting of blood in your stool or on the toilet paper. If you underwent a bowel prep for your procedure, you may not have a normal bowel movement for a few days.  Please Note:  You might notice some irritation and congestion in your nose or some drainage.  This is from the oxygen used during your procedure.  There is no need for concern and it should clear up in a day or so.  SYMPTOMS TO REPORT IMMEDIATELY:  Following lower endoscopy (colonoscopy or flexible sigmoidoscopy):  Excessive amounts of blood in the stool  Significant tenderness or worsening of abdominal pains  Swelling of the abdomen that is new, acute  Fever of 100F or higher  For urgent or emergent issues, a gastroenterologist can be reached at any hour by calling [phone]. Do not use MyChart messaging for urgent concerns.    DIET:  We do recommend a small meal at first, but then you may proceed to your regular diet.  Drink plenty of  fluids but you should avoid alcoholic beverages for 24 hours.  ACTIVITY:  You should plan to take it easy for the rest of today and you should NOT DRIVE or use heavy machinery until tomorrow (because of the sedation medicines used during the test).    FOLLOW UP: Our staff will call the number listed on your records the next business day following your procedure.  We will call around 7:15- 8:00 am to check on you and address any questions or concerns that you may have regarding the information given to you following your procedure. If we do not reach you, we will leave a message.     If any biopsies were taken you will be contacted by phone or by letter within the next 1-3 weeks.  Please call us at [phone] if you have not heard about the biopsies in 3 weeks.    SIGNATURES/CONFIDENTIALITY: You and/or your care partner have signed paperwork which will be entered into your electronic medical record.  These signatures attest to the fact that that the information above on your After Visit Summary has been reviewed and is understood.  Full responsibility of the confidentiality of this discharge information lies with you and/or your care-partner.

## 2023-04-03 NOTE — Op Note (Addendum)
[name] Procedure Date: 04/03/2023 8:54 AM MRN: [id number] Endoscopist: Napoleon Form , MD, [id number] Age: 68 Referring MD:  Date of Birth: 01-Sep-1955 Gender: Male Account #: [id number] Procedure:                Colonoscopy Indications:              High risk colon cancer surveillance: Personal                            history of multiple (3 or more) adenomas, High risk                            colon cancer surveillance: Personal history of                            adenoma less than 10 mm in size Medicines:                Monitored Anesthesia Care Procedure:                Pre-Anesthesia Assessment:                           - Prior to the procedure, a History and Physical                            was performed, and patient medications and                            allergies were reviewed. The patient's tolerance of                            previous anesthesia was also reviewed. The risks                            and benefits of the procedure and the sedation                            options and risks were discussed with the patient.                            All questions were answered, and informed consent                            was obtained. Prior Anticoagulants: The patient has                            taken no anticoagulant or antiplatelet agents. ASA                            Grade Assessment: II - A patient with mild systemic                            disease. After reviewing the risks and benefits,  the patient was deemed in satisfactory condition to                            undergo the procedure.                           After obtaining informed consent, the colonoscope                            was passed under direct vision. Throughout the                            procedure, the patient's blood pressure, pulse, and                            oxygen saturations were  monitored continuously. The                            Olympus Scope SN [id number] was introduced through the                            anus and advanced to the the cecum, identified by                            appendiceal orifice and ileocecal valve. The                            colonoscopy was performed without difficulty. The                            patient tolerated the procedure well. The quality                            of the bowel preparation was adequate. The                            ileocecal valve, appendiceal orifice, and rectum                            were not photographed due to technical error with                            Provation. Scope In: 8:24:57 AM Scope Out: 8:49:40 AM Scope Withdrawal Time: 0 hours 16 minutes 16 seconds  Total Procedure Duration: 0 hours 24 minutes 43 seconds  Findings:                 The perianal and digital rectal examinations were                            normal.                           Four sessile polyps were found in the rectum,  transverse colon and ascending colon. The polyps                            were 3 to 6 mm in size. These polyps were removed                            with a cold snare. Resection and retrieval were                            complete.                           Scattered small-mouthed diverticula were found in                            the sigmoid colon and descending colon.                           Non-bleeding external and internal hemorrhoids were                            found during retroflexion. The hemorrhoids were                            medium-sized. Complications:            No immediate complications. Estimated Blood Loss:     Estimated blood loss was minimal. Impression:               - Four 3 to 6 mm polyps in the rectum, in the                            transverse colon and in the ascending colon,                            removed with a cold  snare. Resected and retrieved.                           - Diverticulosis in the sigmoid colon and in the                            descending colon.                           - Non-bleeding external and internal hemorrhoids. Recommendation:           - Patient has a contact number available for                            emergencies. The signs and symptoms of potential                            delayed complications were discussed with the                            patient.  Return to normal activities tomorrow.                            Written discharge instructions were provided to the                            patient.                           - Resume previous diet.                           - Continue present medications.                           - Await pathology results.                           - Repeat colonoscopy in 3 - 5 years for                            surveillance based on pathology results. Napoleon Form, MD 04/03/2023 9:02:24 AM This report has been signed electronically.

## 2023-04-03 NOTE — Progress Notes (Signed)
Sedate, gd SR, tolerated procedure well, VSS, report to RN 

## 2023-04-04 ENCOUNTER — Telehealth: Payer: Self-pay

## 2023-04-04 NOTE — Telephone Encounter (Signed)
  Follow up Call-     04/03/2023    7:19 AM  Call back number  Post procedure Call Back phone  # [phone]  Permission to leave phone message Yes    Post op call attempted, no answer, left VM.

## 2023-04-05 LAB — SURGICAL PATHOLOGY

## 2023-04-18 ENCOUNTER — Encounter: Payer: Self-pay | Admitting: Gastroenterology
